# Patient Record
Sex: Male | Born: 1975
Health system: Southern US, Community
[De-identification: ages and names within clinical notes are randomized; demographics above are authoritative.]

## PROBLEM LIST (undated history)

## (undated) ENCOUNTER — Ambulatory Visit: Discharge status: Absent without leave | Payer: 59

## (undated) DIAGNOSIS — E785 Hyperlipidemia, unspecified: Secondary | ICD-10-CM

## (undated) DIAGNOSIS — K219 Gastro-esophageal reflux disease without esophagitis: Secondary | ICD-10-CM

## (undated) DIAGNOSIS — M549 Dorsalgia, unspecified: Secondary | ICD-10-CM

## (undated) DIAGNOSIS — I10 Essential (primary) hypertension: Secondary | ICD-10-CM

## (undated) DIAGNOSIS — K5900 Constipation, unspecified: Secondary | ICD-10-CM

## (undated) HISTORY — DX: Gastro-esophageal reflux disease without esophagitis: K21.9

## (undated) HISTORY — DX: Constipation, unspecified: K59.00

## (undated) HISTORY — DX: Hyperlipidemia, unspecified: E78.5

## (undated) HISTORY — DX: Dorsalgia, unspecified: M54.9

## (undated) HISTORY — DX: Essential (primary) hypertension: I10

## (undated) HISTORY — PX: VASECTOMY: SHX75

---

## 2013-10-11 ENCOUNTER — Emergency Department (HOSPITAL_COMMUNITY)
Admission: EM | Admit: 2013-10-11 | Discharge: 2013-10-11 | Disposition: A | Discharge status: Home / Self Care | Payer: 59 | Attending: Emergency Medicine | Admitting: Emergency Medicine

## 2013-10-11 ENCOUNTER — Emergency Department (HOSPITAL_COMMUNITY): Payer: 59

## 2013-10-11 ENCOUNTER — Encounter (HOSPITAL_COMMUNITY): Payer: Self-pay | Admitting: Emergency Medicine

## 2013-10-11 DIAGNOSIS — R51 Headache: Secondary | ICD-10-CM | POA: Insufficient documentation

## 2013-10-11 DIAGNOSIS — Z79899 Other long term (current) drug therapy: Secondary | ICD-10-CM | POA: Insufficient documentation

## 2013-10-11 DIAGNOSIS — I10 Essential (primary) hypertension: Secondary | ICD-10-CM | POA: Insufficient documentation

## 2013-10-11 DIAGNOSIS — M5020 Other cervical disc displacement, unspecified cervical region: Secondary | ICD-10-CM

## 2013-10-11 DIAGNOSIS — R519 Headache, unspecified: Secondary | ICD-10-CM

## 2013-10-11 LAB — CBC WITH DIFFERENTIAL/PLATELET
Eosinophils Absolute: 0.1 10*3/uL (ref 0.0–0.7)
Eosinophils Relative: 2 % (ref 0–5)
Hemoglobin: 15.7 g/dL (ref 13.0–17.0)
Lymphocytes Relative: 40 % (ref 12–46)
Lymphs Abs: 3 10*3/uL (ref 0.7–4.0)
Monocytes Absolute: 0.4 10*3/uL (ref 0.1–1.0)
Monocytes Relative: 5 % (ref 3–12)
Neutro Abs: 3.9 10*3/uL (ref 1.7–7.7)
Neutrophils Relative %: 53 % (ref 43–77)
Platelets: 284 10*3/uL (ref 150–400)
RBC: 5.4 MIL/uL (ref 4.22–5.81)
WBC: 7.4 10*3/uL (ref 4.0–10.5)

## 2013-10-11 LAB — POCT I-STAT, CHEM 8
BUN: 12 mg/dL (ref 6–23)
Chloride: 100 mEq/L (ref 96–112)
Creatinine, Ser: 1.3 mg/dL (ref 0.50–1.35)
Glucose, Bld: 86 mg/dL (ref 70–99)
Hemoglobin: 15.3 g/dL (ref 13.0–17.0)
Potassium: 3.5 mEq/L (ref 3.5–5.1)
Sodium: 140 mEq/L (ref 135–145)
TCO2: 26 mmol/L (ref 0–100)

## 2013-10-11 LAB — GLUCOSE, CAPILLARY

## 2013-10-11 MED ORDER — METOCLOPRAMIDE HCL 5 MG/ML IJ SOLN
10.0000 mg | Freq: Once | INTRAMUSCULAR | Status: AC
  Administered 2013-10-11: 10 mg via INTRAVENOUS
  Filled 2013-10-11: qty 2

## 2013-10-11 MED ORDER — METHYLPREDNISOLONE 4 MG PO KIT: PACK | ORAL | Status: DC

## 2013-10-11 MED ORDER — DEXAMETHASONE SODIUM PHOSPHATE 10 MG/ML IJ SOLN
10.0000 mg | Freq: Once | INTRAMUSCULAR | Status: AC
  Administered 2013-10-11: 10 mg via INTRAVENOUS
  Filled 2013-10-11: qty 1

## 2013-10-11 MED ORDER — DIPHENHYDRAMINE HCL 50 MG/ML IJ SOLN
25.0000 mg | Freq: Once | INTRAMUSCULAR | Status: AC
  Administered 2013-10-11: 25 mg via INTRAVENOUS
  Filled 2013-10-11: qty 1

## 2013-10-11 MED ORDER — SODIUM CHLORIDE 0.9 % IV SOLN
Freq: Once | INTRAVENOUS | Status: AC
  Administered 2013-10-11: 20:00:00 via INTRAVENOUS

## 2013-10-11 NOTE — ED Notes (Signed)
Patient transported to CT 

## 2013-10-11 NOTE — ED Notes (Signed)
Reports onset LUE numbness since yesterday at 1300 which has continued till today. Upon awakening today noticed left side face & neck numb in addition to LUE. Denies weakness. Went to MD's office today, newly diagnosed HTN, took first dose BP med this afternoon. Was instructed to go to ED for h/a or worsening symptoms. States h/a started at 1530.

## 2013-10-11 NOTE — ED Notes (Signed)
CBG taken = 85 

## 2013-10-11 NOTE — ED Notes (Signed)
MD at Bedside.

## 2013-10-11 NOTE — ED Provider Notes (Addendum)
CSN: [id number]     Arrival date & time 10/11/13  1558 History   First MD Initiated Contact with Patient 10/11/13 1832     Chief Complaint  Patient presents with  . Hypertension   (Consider location/radiation/quality/duration/timing/severity/associated sxs/prior Treatment) HPI Comments: Pt states yesterday he noticed left arm numbness and tingling worse in the C6 distribution but affecting the whole arm.  However when he woke up today the arm was still affected but now the left cheek and side of neck.  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  He saw PCP today which they noticed he was HTN at 189/somthing and gave him lisinopril and d/ced home.  Pt took lisinopirl at noon and developed a occipital headache around 3 and rechecked pressure and it was 167/90.  He came here for further eval.  No prior hx of HTN.  Pt last checked BP this summer and 130's.    The history is provided by the patient.    History reviewed. No pertinent past medical history. History reviewed. No pertinent past surgical history. History reviewed. No pertinent family history. History  Substance Use Topics  . Smoking status: Never Smoker   . Smokeless tobacco: Not on file  . Alcohol Use: No    Review of Systems  Constitutional: Negative for fever.  Respiratory: Negative for cough, chest tightness and shortness of breath.   Cardiovascular: Negative for chest pain, palpitations and leg swelling.  Gastrointestinal: Negative for nausea, vomiting and abdominal pain.  Neurological: Positive for numbness and headaches. Negative for facial asymmetry and weakness.  All other systems reviewed and are negative.    Allergies  Review of patient's allergies indicates no known allergies.  Home Medications   Current Outpatient Rx  Name  Route  Sig  Dispense  Refill  . aspirin-acetaminophen-caffeine (EXCEDRIN MIGRAINE)  250-250-65 MG per tablet   Oral   Take 2 tablets by mouth daily as needed for headache.         . lisinopril (PRINIVIL,ZESTRIL) 20 MG tablet   Oral   Take 20 mg by mouth daily.          BP 131/82  Pulse 60  Temp(Src) 98.5 F (36.9 C) (Oral)  Resp 16  SpO2 97% Physical Exam  Nursing note and vitals reviewed. Constitutional: He is oriented to person, place, and time. He appears well-developed and well-nourished. No distress.  HENT:  Head: Normocephalic and atraumatic.  Mouth/Throat: Oropharynx is clear and moist.  Eyes: Conjunctivae and EOM are normal. Pupils are equal, round, and reactive to light.  Neck: Normal range of motion. Neck supple. Normal carotid pulses present. No spinous process tenderness and no muscular tenderness present. Carotid bruit is not present. Normal range of motion present.  Cardiovascular: Normal rate, regular rhythm and intact distal pulses.   No murmur heard. Pulmonary/Chest: Effort normal and breath sounds normal. No respiratory distress. He has no wheezes. He has no rales.  Abdominal: Soft. He exhibits no distension. There is no tenderness. There is no rebound and no guarding.  Musculoskeletal: Normal range of motion. He exhibits no edema and no tenderness.  Neurological: He is alert and oriented to person, place, and time. He has normal strength. Coordination and gait normal.  Decreased sensation in the left face in V3/V2 dermatome and also in C2.  Arm globally decreased sensation but most pronounced in the C6  distribution  Skin: Skin is warm and dry. No rash noted. No erythema.  Psychiatric: He has a normal mood and affect. His behavior is normal.    ED Course  Procedures (including critical care time) Labs Review Labs Reviewed  GLUCOSE, CAPILLARY  GLUCOSE, CAPILLARY  CBC WITH DIFFERENTIAL  POCT I-STAT, CHEM 8   Imaging Review Ct Head (brain) Wo Contrast  10/11/2013   CLINICAL DATA:  Headache, hypertension, numbness left side of face and  hand  EXAM: CT HEAD WITHOUT CONTRAST  TECHNIQUE: Contiguous axial images were obtained from the base of the skull through the vertex without intravenous contrast.  COMPARISON:  None  FINDINGS: Normal ventricular morphology.  No midline shift or mass effect.  Normal appearance of brain parenchyma.  No definite intracranial hemorrhage, mass lesion or evidence acute infarction.  Single tiny focus of hyperdensity identified at the high left parietal region image 26, nonspecific.  Bones and sinuses unremarkable.  IMPRESSION: No definite acute intracranial abnormalities.   Electronically Signed   By: Ulyses Southward M.D.   On: 10/11/2013 18:56   Mr Brain Wo Contrast  10/11/2013   CLINICAL DATA:  Hypertension, numbness and tingling in extremities.  EXAM: MRI HEAD WITHOUT CONTRAST  MRI CERVICAL SPINE WITHOUT CONTRAST  TECHNIQUE: Multiplanar, multiecho pulse sequences of the brain and surrounding structures, and cervical spine, to include the craniocervical junction and cervicothoracic junction, were obtained without intravenous contrast.  COMPARISON:  Prior head CT performed earlier on the same day  FINDINGS: MRI HEAD FINDINGS  A few scattered subcentimeter foci of hyperintense T2/FLAIR signal are seen within the deep white matter of both frontal lobes, most evident within the deep white matter of the left frontal lobe (series 7, image 17). These measure up to 5 mm in diameter. These are nonspecific, and may be related to chronic small vessel disk ischemic disease or possibly migrainous disorder. No signal foci to suggest underlying demyelinating disease identified. Corpus callosum is normal. No infratentorial lesions identified.  No mass or midline shift. CSF containing spaces are within normal limits without evidence of significant atrophy. Ventricles are normal in size without evidence of hydrocephalus. There is no extra-axial fluid collection. The craniocervical junction demonstrates a normal appearance. Pituitary gland  is normal. Orbits, optic nerves, and optic chiasm are within normal limits.  No diffusion-weighted signal abnormality to suggest acute intracranial infarct identified. Normal flow voids are seen within the intracranial circulation. There is no intracranial hemorrhage on gradient echo sequence.  The calvarium demonstrates a normal signal intensity with normal appearance. Scalp soft tissues are within normal limits.  Paranasal sinuses and mastoid air cells are well pneumatized and free of fluid.  MRI CERVICAL SPINE FINDINGS  The craniocervical junction a is widely patent and normal in appearance.  The vertebral bodies are normally aligned with preservation of the normal cervical lordosis. Vertebral body heights are well maintained. Intervertebral disc heights are well preserved.  Signal intensity within the vertebral body bone marrow and spinal cord is normal.  At C2-3, no canal or neural foraminal stenosis identified. No focal disc protrusion or significant degenerative disc disease.  At C3-4, there is mild degenerative disk osteophyte and bilateral facet arthrosis. No significant canal or neural foraminal stenosis.  At C4-5, there is no significant canal or neural foraminal stenosis. Mild left-sided facet arthrosis is noted at this level.  At C5-6, there is a right paracentral disc protrusion which flattens the ventral thecal sac and partially flattens the right lateral aspect of the spinal cord (  series 17, image 11). The thecal sac remains widely patent. Mild bilateral facet arthrosis with uncovertebral osteophytosis is present at this level resulting in moderate right with mild left foraminal stenosis.  At C6-7, there is moderate bilateral facet arthrosis with mild diffuse degenerative disk osteophyte. There is resultant mild bilateral foraminal stenosis, right greater than left. No significant central canal stenosis.  At C7-T1, there is mild diffuse degenerative disk osteophyte without significant canal or neural  foraminal stenosis.  Visualized soft tissues of the neck are within normal limits.  IMPRESSION: MRI HEAD:  1. No acute intracranial infarct or other process identified. 2. Few scattered subcentimeter foci of T2/FLAIR hyperintensity within the deep white matter of both frontal lobes. These findings are nonspecific, and may be related to chronic microvascular ischemic changes. Foci such is these have also been described with migrainous disorders. These would not be typical of underlying demyelinating process.  MRI CERVICAL SPINE:  1. Right paracentral disc protrusion at C5-6 with resultant moderate right and mild left foraminal stenosis. There is flattening of the right ventral thecal sac and right aspect of the spinal cord at this level. 2. Additional mild multilevel degenerative disc disease and facet arthrosis as detailed above. No significant neural impingement or canal stenosis identified within the cervical spine to explain numbness/ tingling in extremities.   Electronically Signed   By: Rise Mu M.D.   On: 10/11/2013 22:41   Mr Cervical Spine Wo Contrast  10/11/2013   CLINICAL DATA:  Hypertension, numbness and tingling in extremities.  EXAM: MRI HEAD WITHOUT CONTRAST  MRI CERVICAL SPINE WITHOUT CONTRAST  TECHNIQUE: Multiplanar, multiecho pulse sequences of the brain and surrounding structures, and cervical spine, to include the craniocervical junction and cervicothoracic junction, were obtained without intravenous contrast.  COMPARISON:  Prior head CT performed earlier on the same day  FINDINGS: MRI HEAD FINDINGS  A few scattered subcentimeter foci of hyperintense T2/FLAIR signal are seen within the deep white matter of both frontal lobes, most evident within the deep white matter of the left frontal lobe (series 7, image 17). These measure up to 5 mm in diameter. These are nonspecific, and may be related to chronic small vessel disk ischemic disease or possibly migrainous disorder. No signal  foci to suggest underlying demyelinating disease identified. Corpus callosum is normal. No infratentorial lesions identified.  No mass or midline shift. CSF containing spaces are within normal limits without evidence of significant atrophy. Ventricles are normal in size without evidence of hydrocephalus. There is no extra-axial fluid collection. The craniocervical junction demonstrates a normal appearance. Pituitary gland is normal. Orbits, optic nerves, and optic chiasm are within normal limits.  No diffusion-weighted signal abnormality to suggest acute intracranial infarct identified. Normal flow voids are seen within the intracranial circulation. There is no intracranial hemorrhage on gradient echo sequence.  The calvarium demonstrates a normal signal intensity with normal appearance. Scalp soft tissues are within normal limits.  Paranasal sinuses and mastoid air cells are well pneumatized and free of fluid.  MRI CERVICAL SPINE FINDINGS  The craniocervical junction a is widely patent and normal in appearance.  The vertebral bodies are normally aligned with preservation of the normal cervical lordosis. Vertebral body heights are well maintained. Intervertebral disc heights are well preserved.  Signal intensity within the vertebral body bone marrow and spinal cord is normal.  At C2-3, no canal or neural foraminal stenosis identified. No focal disc protrusion or significant degenerative disc disease.  At C3-4, there is mild degenerative disk osteophyte  and bilateral facet arthrosis. No significant canal or neural foraminal stenosis.  At C4-5, there is no significant canal or neural foraminal stenosis. Mild left-sided facet arthrosis is noted at this level.  At C5-6, there is a right paracentral disc protrusion which flattens the ventral thecal sac and partially flattens the right lateral aspect of the spinal cord (series 17, image 11). The thecal sac remains widely patent. Mild bilateral facet arthrosis with  uncovertebral osteophytosis is present at this level resulting in moderate right with mild left foraminal stenosis.  At C6-7, there is moderate bilateral facet arthrosis with mild diffuse degenerative disk osteophyte. There is resultant mild bilateral foraminal stenosis, right greater than left. No significant central canal stenosis.  At C7-T1, there is mild diffuse degenerative disk osteophyte without significant canal or neural foraminal stenosis.  Visualized soft tissues of the neck are within normal limits.  IMPRESSION: MRI HEAD:  1. No acute intracranial infarct or other process identified. 2. Few scattered subcentimeter foci of T2/FLAIR hyperintensity within the deep white matter of both frontal lobes. These findings are nonspecific, and may be related to chronic microvascular ischemic changes. Foci such is these have also been described with migrainous disorders. These would not be typical of underlying demyelinating process.  MRI CERVICAL SPINE:  1. Right paracentral disc protrusion at C5-6 with resultant moderate right and mild left foraminal stenosis. There is flattening of the right ventral thecal sac and right aspect of the spinal cord at this level. 2. Additional mild multilevel degenerative disc disease and facet arthrosis as detailed above. No significant neural impingement or canal stenosis identified within the cervical spine to explain numbness/ tingling in extremities.   Electronically Signed   By: Rise Mu M.D.   On: 10/11/2013 22:41    EKG Interpretation   None       MDM   1. Cervical disc herniation   2. Headache     Patient with unusual symptoms today of hypertension, headache that were noted today but left-sided arm and facial numbness starting yesterday. Patient has subjective decreased sensation in the left distribution as described above. He does work at a computer all day long so concern for cervical pathology versus complicated migraine versus other  intracranial lesion. A head CT initially noted no acute pathology however a more detailed report remarks upon a nonspecific hypodensity in the high left parietal region which could be a cause of his symptoms. Patient had never had hypertension before today he states he checked in approximately 5 months ago that was normal and since that time he has lost 30 pounds. Over the last month he states he had the more salty foods and not watched his diet as closely but today when he was at the doctor's office his pressure was in the 180s systolic and he was given lisinopril which is improved his blood pressure.  He is still complaining of headaches will give him a headache cocktail but he did not get headaches routinely. The headache is not characteristic of a subarachnoid hemorrhage and he denies any infectious symptoms.  We'll get an MR of the head and neck for further evaluation.  11:05 PM MRI without brain abnormality.  MR c-spine with C6 findings.  This would explain pt's pain.  Will treat with steroids.  HA resolved.  Discussed watching blood pressure but may not need lisinopril.  Gwyneth Sprout, MD 10/11/13 1610  Gwyneth Sprout, MD 10/11/13 [phone]

## 2013-10-11 NOTE — ED Notes (Signed)
Patient transported to MR. 

## 2013-10-11 NOTE — ED Notes (Signed)
Reconnected all the diagnostic equipment.

## 2013-10-11 NOTE — ED Notes (Addendum)
Patient returned from MR. 

## 2013-10-11 NOTE — ED Notes (Signed)
Pt reports 2 weeks ago he noticed his vision was blurry, yesterday he had L arm numbness and then woke today to shave and the l side of his face felt numb, went to doctor and his BP was elevated. States they gave him lisinopril in the office but when he got home checked and it was still 160s systolic and his head has started to hurt. A&ox4, no past history of HTN

## 2013-10-12 ENCOUNTER — Other Ambulatory Visit (HOSPITAL_BASED_OUTPATIENT_CLINIC_OR_DEPARTMENT_OTHER): Payer: Self-pay | Admitting: Family Medicine

## 2013-10-12 DIAGNOSIS — I10 Essential (primary) hypertension: Secondary | ICD-10-CM

## 2013-10-16 ENCOUNTER — Ambulatory Visit (HOSPITAL_BASED_OUTPATIENT_CLINIC_OR_DEPARTMENT_OTHER)
Admission: RE | Admit: 2013-10-16 | Discharge: 2013-10-16 | Disposition: A | Discharge status: Home / Self Care | Payer: 59 | Source: Ambulatory Visit | Attending: Family Medicine | Admitting: Family Medicine

## 2013-10-16 DIAGNOSIS — R209 Unspecified disturbances of skin sensation: Secondary | ICD-10-CM | POA: Insufficient documentation

## 2013-10-16 DIAGNOSIS — I10 Essential (primary) hypertension: Secondary | ICD-10-CM | POA: Insufficient documentation

## 2013-10-16 DIAGNOSIS — I6529 Occlusion and stenosis of unspecified carotid artery: Secondary | ICD-10-CM | POA: Insufficient documentation

## 2014-09-03 ENCOUNTER — Other Ambulatory Visit: Payer: Self-pay | Admitting: Family Medicine

## 2014-09-03 ENCOUNTER — Ambulatory Visit (HOSPITAL_BASED_OUTPATIENT_CLINIC_OR_DEPARTMENT_OTHER)
Admission: RE | Admit: 2014-09-03 | Discharge: 2014-09-03 | Disposition: A | Discharge status: Home / Self Care | Payer: 59 | Source: Ambulatory Visit | Attending: Family Medicine | Admitting: Family Medicine

## 2014-09-03 DIAGNOSIS — R1010 Upper abdominal pain, unspecified: Secondary | ICD-10-CM | POA: Insufficient documentation

## 2014-11-26 IMAGING — US US ABDOMEN COMPLETE
1 series · 14 of 25 positions shown · non-contrast
Comparison: None.

CLINICAL DATA: Right upper quadrant pain epigastric pain for 2 days

EXAM:
ULTRASOUND ABDOMEN COMPLETE

[Series 1: us abdomen complete · 0.27mm/px · 14 of 68 slices shown]
[im 1/68]
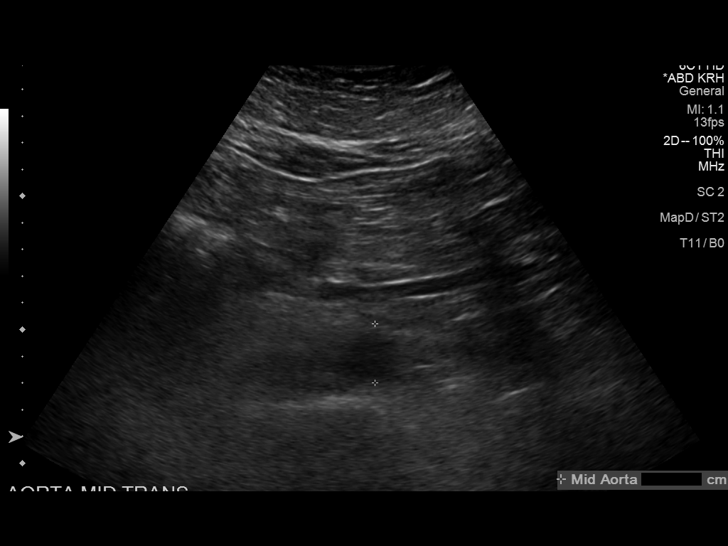
[im 6/68]
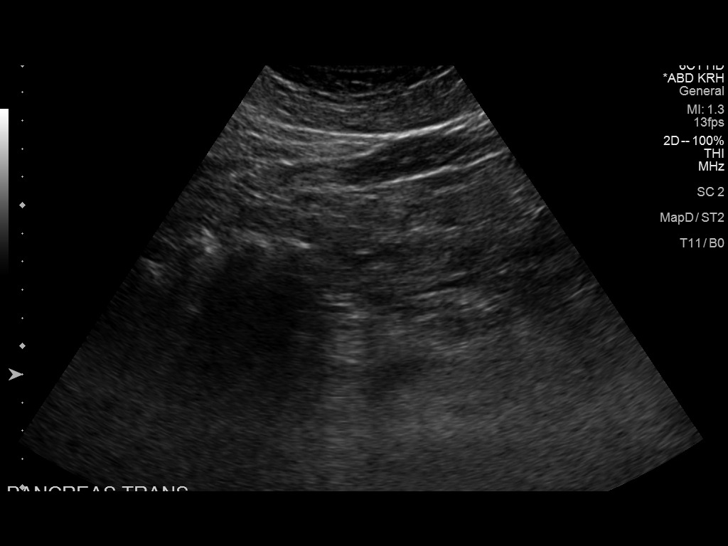
[im 12/68]
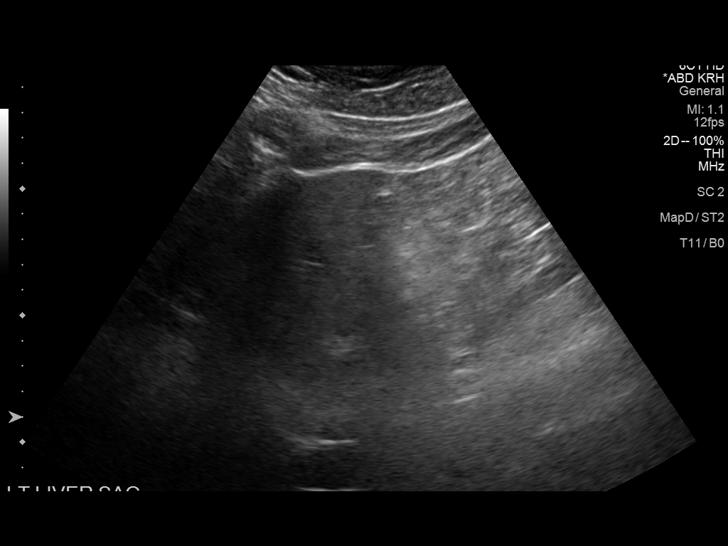
[im 17/68]
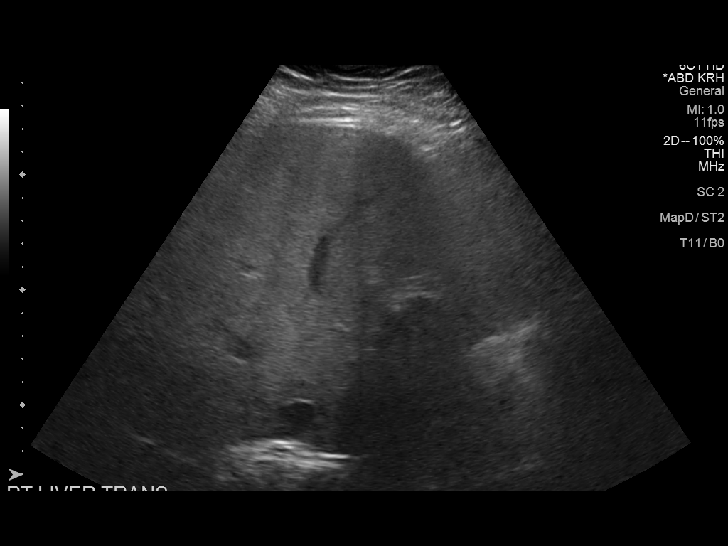
[im 23/68]
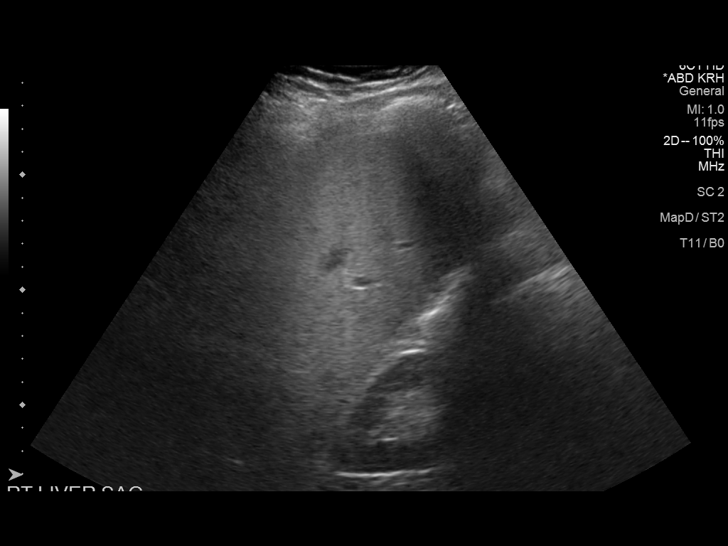
[im 26/68]
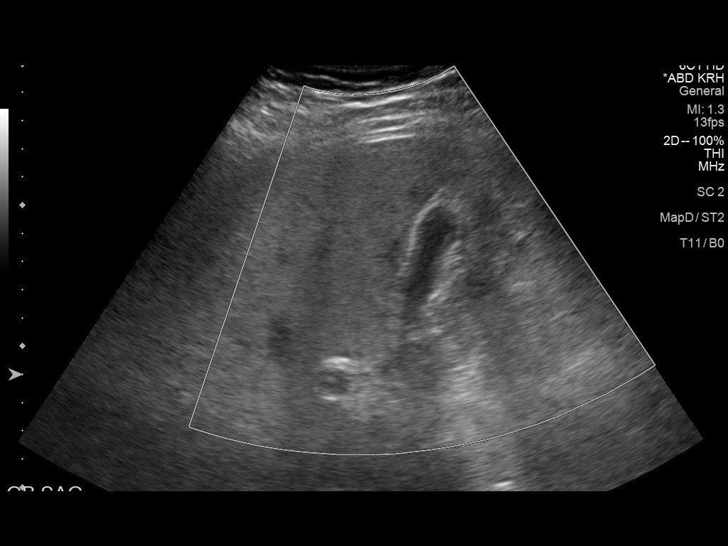
[im 31/68]
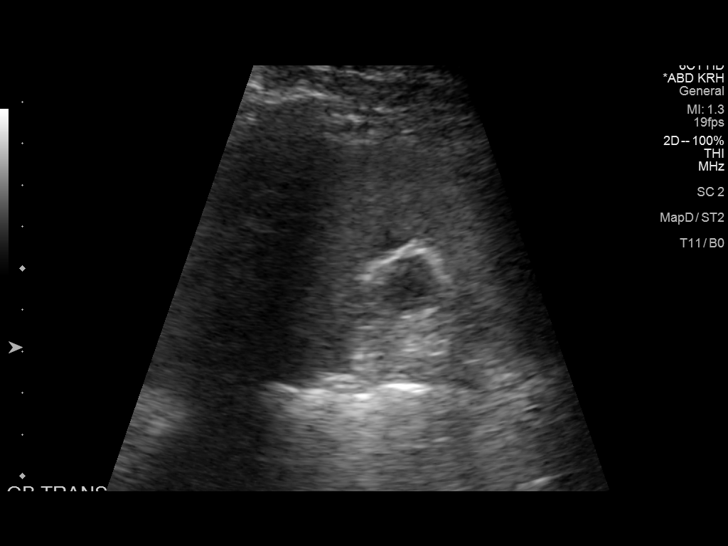
[im 37/68]
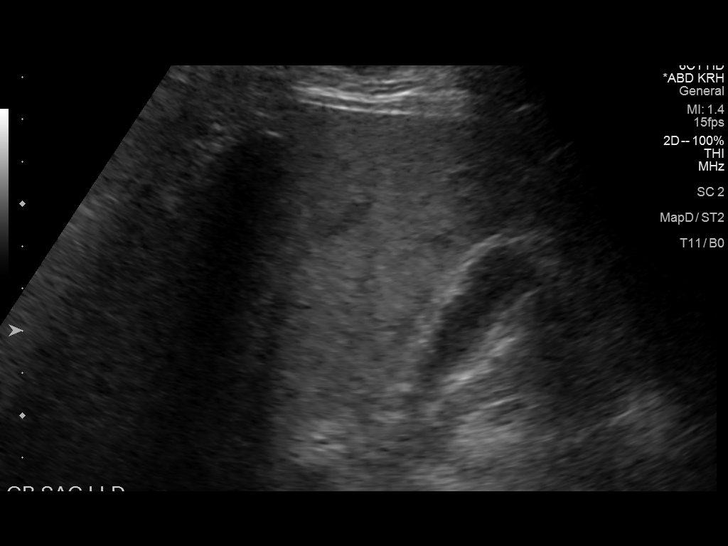
[im 42/68]
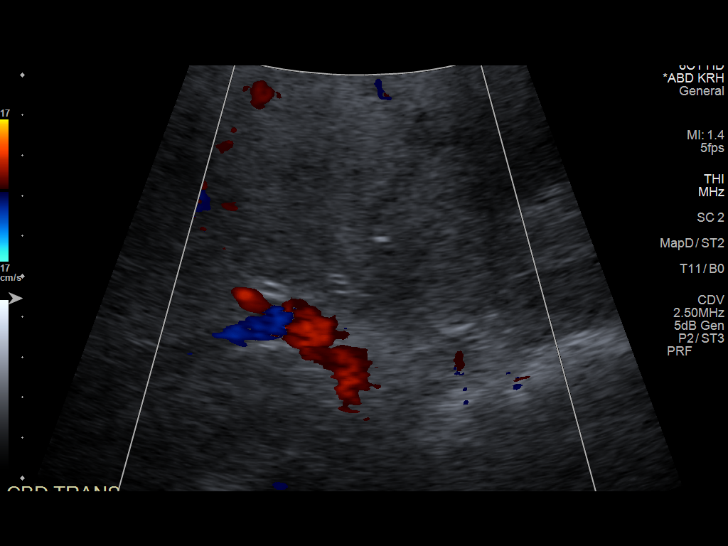
[im 45/68]
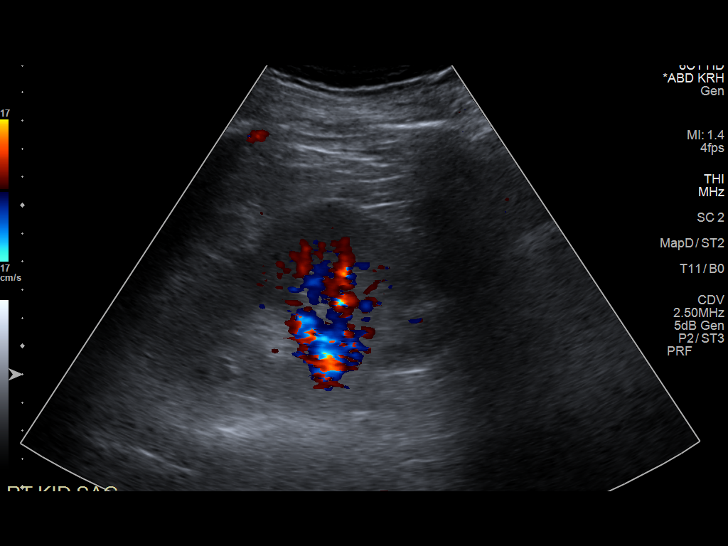
[im 51/68]
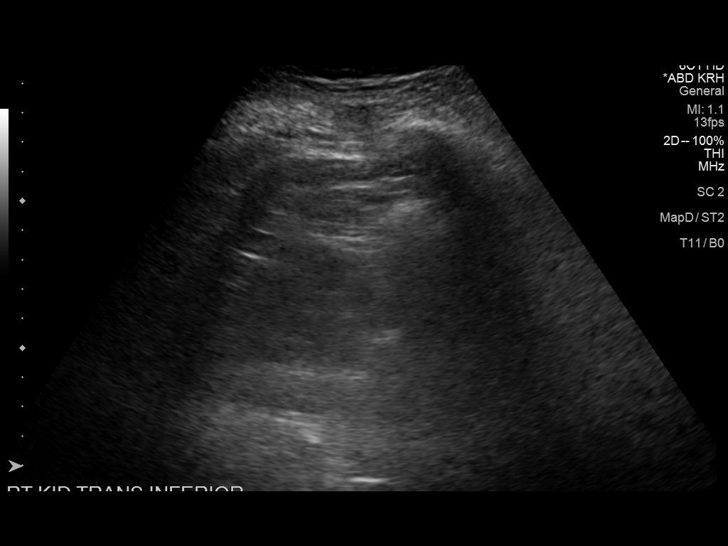
[im 56/68]
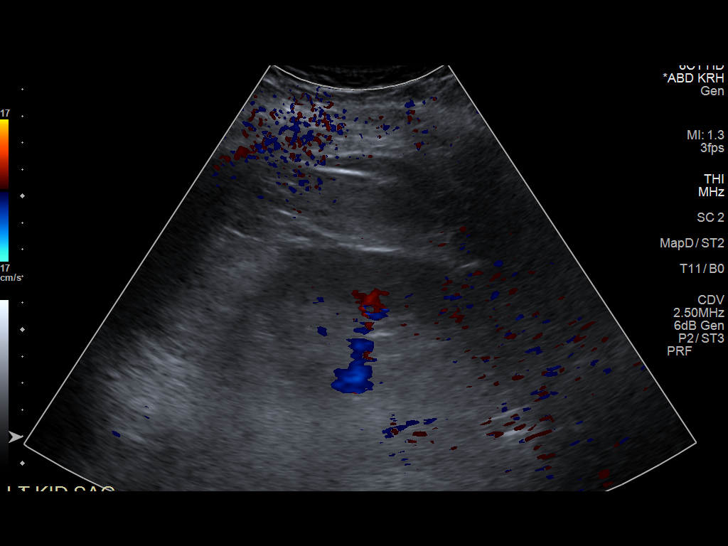
[im 62/68]
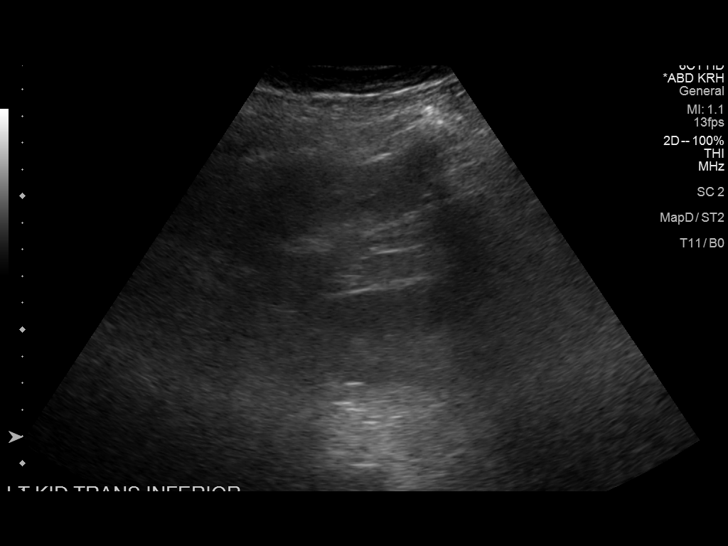
[im 68/68]
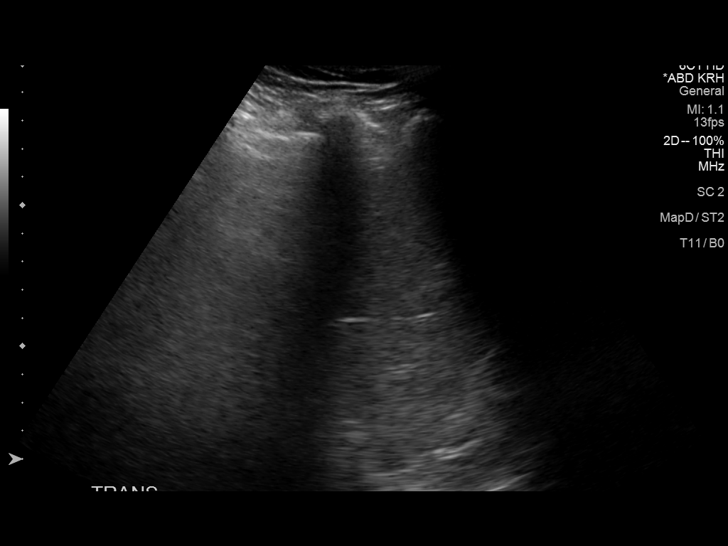

[14 of 25 positions shown; findings below may reference images not displayed]

FINDINGS: Gallbladder: Gallbladder is contracted with thickened wall measures
11 mm thickness. No pericholecystic fluid. No shadowing gallstones.
No sonographic Murphy's sign.

Common bile duct: Diameter: 2 mm in diameter within normal limits.

Liver: No focal hepatic mass. Liver shows diffuse increased
echogenicity suspicious for fatty infiltration.

IVC: Limited assessment due to bowel gas.

Pancreas: Limited assessment due to abundant bowel gas

Spleen: Measures 4.6 cm in length.  No focal splenic lesion.

Right Kidney: Length: 11.7 cm. Echogenicity within normal limits. No
mass or hydronephrosis visualized.

Left Kidney: Length: 13 cm. Echogenicity within normal limits. No
mass or hydronephrosis visualized.

Abdominal aorta: No aneurysm visualized. Measures up to 2.1 cm in
diameter.

Other findings: None.
IMPRESSION: 1. Contracted gallbladder with thickened wall without sonographic
Murphy sign or pericholecystic fluid. No gallstones. Normal CBD.
2. No hydronephrosis.
3. No aortic aneurysm.

## 2016-03-15 ENCOUNTER — Encounter: Payer: Self-pay | Admitting: Family Medicine

## 2016-03-15 DIAGNOSIS — I10 Essential (primary) hypertension: Secondary | ICD-10-CM | POA: Insufficient documentation

## 2016-03-15 DIAGNOSIS — E785 Hyperlipidemia, unspecified: Secondary | ICD-10-CM | POA: Insufficient documentation

## 2016-03-16 ENCOUNTER — Encounter: Payer: Self-pay | Admitting: Family Medicine

## 2016-03-16 ENCOUNTER — Ambulatory Visit (INDEPENDENT_AMBULATORY_CARE_PROVIDER_SITE_OTHER): Payer: Managed Care, Other (non HMO) | Admitting: Family Medicine

## 2016-03-16 VITALS — BP 130/90 | HR 60 | Temp 98.5°F | Resp 12 | Ht 66.0 in | Wt 233.0 lb

## 2016-03-16 DIAGNOSIS — Z6837 Body mass index (BMI) 37.0-37.9, adult: Secondary | ICD-10-CM | POA: Diagnosis not present

## 2016-03-16 DIAGNOSIS — E785 Hyperlipidemia, unspecified: Secondary | ICD-10-CM

## 2016-03-16 DIAGNOSIS — Z131 Encounter for screening for diabetes mellitus: Secondary | ICD-10-CM | POA: Diagnosis not present

## 2016-03-16 DIAGNOSIS — Z0000 Encounter for general adult medical examination without abnormal findings: Secondary | ICD-10-CM

## 2016-03-16 DIAGNOSIS — I10 Essential (primary) hypertension: Secondary | ICD-10-CM | POA: Diagnosis not present

## 2016-03-16 LAB — LIPID PANEL
CHOL/HDL RATIO: 6
Cholesterol: 239 mg/dL — ABNORMAL HIGH (ref 0–200)
HDL: 42.9 mg/dL (ref >39.00)
LDL CALC: 167 mg/dL — AB (ref 0–99)
NonHDL: 196.01
Triglycerides: 146 mg/dL (ref 0.0–149.0)
VLDL: 29.2 mg/dL (ref 0.0–40.0)

## 2016-03-16 LAB — COMPREHENSIVE METABOLIC PANEL
ALBUMIN: 4.8 g/dL (ref 3.5–5.2)
ALT: 22 U/L (ref 0–53)
AST: 20 U/L (ref 0–37)
Alkaline Phosphatase: 64 U/L (ref 39–117)
BUN: 18 mg/dL (ref 6–23)
CHLORIDE: 98 meq/L (ref 96–112)
CO2: 31 meq/L (ref 19–32)
CREATININE: 1.02 mg/dL (ref 0.40–1.50)
Calcium: 9.9 mg/dL (ref 8.4–10.5)
GFR: 86.02 mL/min (ref >60.00)
Glucose, Bld: 83 mg/dL (ref 70–99)
Potassium: 3.8 mEq/L (ref 3.5–5.1)
SODIUM: 138 meq/L (ref 135–145)
Total Bilirubin: 0.5 mg/dL (ref 0.2–1.2)
Total Protein: 7.5 g/dL (ref 6.0–8.3)

## 2016-03-16 LAB — HEMOGLOBIN A1C: Hgb A1c MFr Bld: 5.9 % (ref 4.6–6.5)

## 2016-03-16 MED ORDER — LISINOPRIL-HYDROCHLOROTHIAZIDE 20-12.5 MG PO TABS: 1.0000 | ORAL_TABLET | Freq: Every day | ORAL | Status: DC

## 2016-03-16 NOTE — Progress Notes (Signed)
Pre visit review using our clinic review tool, if applicable. No additional management support is needed unless otherwise documented below in the visit note. 

## 2016-03-16 NOTE — Patient Instructions (Addendum)
   A few things to remember from today's visit:   1. Routine physical examination  - Hemoglobin A1c  2. Essential hypertension, benign  - lisinopril-hydrochlorothiazide (PRINZIDE,ZESTORETIC) 20-12.5 MG tablet; Take 1 tablet by mouth daily.  Dispense: 90 tablet; Refill: 1 - Comprehensive metabolic panel  3. Hyperlipidemia  - Lipid Panel - Comprehensive metabolic panel  4. BMI 37.0-37.9, adult  - Comprehensive metabolic panel  5. Diabetes mellitus screening  - Hemoglobin A1c       What are some tips for weight loss? People become overweight for many reasons. Weight issues can run in families. They can be caused by unhealthy behaviors and a person's environment. Certain health problems and medicines can also lead to weight gain. There are some simple things you can do to reach and maintain a healthy weight:  Eat 500 fewer calories per day than your body needs to maintain your weight. Women should aim for no more than 1,200 to 1,500 calories per day. Men should aim for 1,500 to 1,800 calories per day. Avoid sweet drinks. These include regular soft drinks, fruit juices, fruit drinks, energy drinks, sweetened iced tea, and flavored milk. Avoid fast foods. Fast foods such as french fries, hamburgers, chicken nuggets, and pizza are high in calories and can cause weight gain. Eat a healthy breakfast. People who skip breakfast tend to weigh more. Don't watch more than two hours of television per day. Chew sugar-free gum between meals to cut down on snacking. Avoid grocery shopping when you're hungry. Pack a healthy lunch instead of eating out to control what and how much you eat. Eat a lot of fruits and vegetables. Aim for about 2 cups of fruit and 2 to 3 cups of vegetables per day. Aim for 150 minutes per week of moderate-intensity exercise (such as brisk walking), or 75 minutes per week of vigorous exercise (such as jogging or running). Be more active. Small changes in physical  activity can easily be added to your daily routine. For example, take the stairs instead of the elevator. Take a walk with your family. A daily walk is a great way to get exercise and to catch up on the day's events.

## 2016-03-16 NOTE — Progress Notes (Signed)
Subjective:    Patient ID: Rodney Beltran, male    DOB: 1976-06-29, 40 y.o.   MRN: [id number]  HPI   [name] is a 40 y.o.male here today to establish care with [facility], I had seen him before in [facility], 2015. Today he is here for his routine physical and needs refills on his antihypertensive medication.  He lives with wife and children.  He has Hx of HTN,HLD, and obesity.  Last routine physical was 08/2014.  He tries to follow a healthy diet but no consistently and currently he is not exercises regularly. His job involves frequent travel, so it is difficult to follow a healthy lifestyle consistently.  Concerns today: None.   Hypertension: Dx a few years ago. Currently h is on Lisinopril-HCTZ 20-12.5 mg daily. He did not take medication this past weekend, resumed it yesterday. No side effects reported. It is otherwise stable, better when he is complaint with diet and medications.  He has not noted unusual headache, visual changes, exertional chest pain, dyspnea,  focal weakness, or edema.  Cr 0.9 in 08/2014.  Hyperlipidemia:  Following a low fat diet. 08/2014 FLP: TC 228, LDL 160,HDL 47, TG 107.   Review of Systems  Constitutional: Negative for fever, activity change, appetite change, fatigue and unexpected weight change.  HENT: Negative for dental problem, hearing loss, mouth sores, nosebleeds, sore throat, trouble swallowing and voice change.   Eyes: Negative for redness and visual disturbance.  Respiratory: Negative for apnea, cough, shortness of breath and wheezing.   Cardiovascular: Negative for chest pain, palpitations and leg swelling.  Gastrointestinal: Negative for nausea, vomiting, abdominal pain and blood in stool.       No changes in bowel habits.  Endocrine: Negative for polydipsia and polyuria.  Genitourinary: Negative for dysuria, hematuria, decreased urine volume, genital sores and testicular pain.  Musculoskeletal:  Positive for arthralgias (occasional after certain activities, mainly knees). Negative for myalgias, back pain, joint swelling and neck pain.  Skin: Negative for color change and rash.  Neurological: Negative for dizziness, seizures, syncope, weakness, numbness and headaches.  Hematological: Negative for adenopathy. Does not bruise/bleed easily.  Psychiatric/Behavioral: Negative for behavioral problems, confusion and sleep disturbance. The patient is not nervous/anxious.      No current outpatient prescriptions on file prior to visit.   No current facility-administered medications on file prior to visit.     Past Medical History  Diagnosis Date  . Hyperlipidemia   . Hypertension     Social History   Social History  . Marital Status: Married    Spouse Name: N/A  . Number of Children: N/A  . Years of Education: N/A   Social History Main Topics  . Smoking status: Never Smoker   . Smokeless tobacco: None  . Alcohol Use: No  . Drug Use: No  . Sexual Activity: Yes   Other Topics Concern  . None   Social History Narrative   Family History  Problem Relation Age of Onset  . Lupus Mother   . Mental illness Maternal Aunt   . Diabetes Maternal Aunt   . Diabetes Maternal Uncle   . Diabetes Maternal Grandmother   . Hypertension Maternal Grandmother   . Hyperlipidemia Maternal Grandmother   . Mental illness Maternal Grandfather      Filed Vitals:   03/16/16 1352  BP: 130/90  Pulse: 60  Temp: 98.5 F (36.9 C)  Resp: 12   Body mass index is 37.63 kg/(m^2).  SpO2 Readings from Last  3 Encounters:  03/16/16 98%  10/11/13 95%       Objective:   Physical Exam  Constitutional: He is oriented to person, place, and time. He appears well-developed. He does not appear ill. No distress.  HENT:  Head: Atraumatic.  Right Ear: Hearing, tympanic membrane, external ear and ear canal normal.  Left Ear: Hearing, tympanic membrane, external ear and ear canal normal.    Mouth/Throat: Uvula is midline, oropharynx is clear and moist and mucous membranes are normal.  Eyes: Conjunctivae, EOM and lids are normal. Pupils are equal, round, and reactive to light.  Neck: Normal range of motion. No thyromegaly present.  Cardiovascular: Normal rate, regular rhythm and normal heart sounds.   No murmur heard. Pulses:      Dorsalis pedis pulses are 2+ on the right side, and 2+ on the left side.  Pulmonary/Chest: Effort normal. He has no wheezes. He has no rales.  Abdominal: Soft. He exhibits no mass. There is no hepatosplenomegaly. There is no tenderness.  Genitourinary:  Refused exam,no concerns.  Musculoskeletal: He exhibits no edema.  No sign of synovitis or deformities apprecited.  Lymphadenopathy:    He has no cervical adenopathy.       Right: No supraclavicular adenopathy present.       Left: No supraclavicular adenopathy present.  Neurological: He is alert and oriented to person, place, and time. He has normal strength. No cranial nerve deficit or sensory deficit. Coordination and gait normal.  Reflex Scores:      Bicep reflexes are 2+ on the right side and 2+ on the left side.      Patellar reflexes are 2+ on the right side and 2+ on the left side. Skin: Skin is warm.  Psychiatric: He has a normal mood and affect.  Well groomed, good eye contact.       Assessment & Plan:   Diagnoses and all orders for this visit:  Routine physical examination  Preventive guidelines discussed. Testicular self-exam periodically recommended. Benefits of a healthy life style for primary and secondary prevention discussed. Next CPE in a year.  -     Hemoglobin A1c  Essential hypertension, benign  Slightly elevated DBP, resumed medication 2 days ago. No changes in current management. DASH diet recommended. Eye exam recommended annually. F/U in 6 months, before if needed.   -     lisinopril-hydrochlorothiazide (PRINZIDE,ZESTORETIC) 20-12.5 MG tablet; Take 1  tablet by mouth daily. -     Comprehensive metabolic panel  Hyperlipidemia  Low fat diet to continue. I will follow labs done today and will give further recommendations accordingly.  -     Lipid Panel -     Comprehensive metabolic panel  BMI 37.0-37.9, adult  We discussed benefits of wt loss as well as adverse effects of obesity. Consistency with healthy diet and physical activity recommended. Weight Watchers is a good option as well as daily brisk walking for 15-30 min as tolerated.  -     Comprehensive metabolic panel  Diabetes mellitus screening  +risk factors: FHx, Hispanic,HTN, and obesity.  -     Hemoglobin A1c      -Patient advised to return if new concerns arise.     Jahara Dail G. SwazilandJordan, MD  [facility]. [place] office.

## 2016-03-17 ENCOUNTER — Encounter: Payer: Self-pay | Admitting: Family Medicine

## 2016-08-03 NOTE — Progress Notes (Signed)
Rodney Beltran D.O. [facility] [address] [place], [place] [address] Phone: [phone] Subjective:    I'm seeing this patient by the request  of:  Betty SwazilandJordan, MD   CC: Left heel pain  BJY:NWGNFAOZHYHPI:Subjective  Rodney RacerJoshua Evas Beltran is a 40 y.o. male coming in with complaint of left heel pain. Patient states that this started approximately 2 months ago. Does not rib number any injury. Seems to be on the lateral aspect the ankle and heel. Patient states it was very more with walking. Seem to get better with rest. Patient did get new shoes that seemed to aggravate it at first but now has improved it somewhat. Patient denies any swelling, numbness.     Past Medical History:  Diagnosis Date  . Hyperlipidemia   . Hypertension    Past Surgical History:  Procedure Laterality Date  . VASECTOMY     Social History   Social History  . Marital status: Married    Spouse name: N/A  . Number of children: N/A  . Years of education: N/A   Social History Main Topics  . Smoking status: Never Smoker  . Smokeless tobacco: None  . Alcohol use No  . Drug use: No  . Sexual activity: Yes   Other Topics Concern  . None   Social History Narrative  . None   No Known Allergies Family History  Problem Relation Age of Onset  . Lupus Mother   . Mental illness Maternal Aunt   . Diabetes Maternal Aunt   . Diabetes Maternal Uncle   . Diabetes Maternal Grandmother   . Hypertension Maternal Grandmother   . Hyperlipidemia Maternal Grandmother   . Mental illness Maternal Grandfather     Past medical history, social, surgical and family history all reviewed in electronic medical record.  No pertanent information unless stated regarding to the chief complaint.   Review of Systems: No headache, visual changes, nausea, vomiting, diarrhea, constipation, dizziness, abdominal pain, skin rash, fevers, chills, night sweats, weight loss, swollen lymph nodes, body aches, joint swelling, muscle  aches, chest pain, shortness of breath, mood changes.   Objective  Blood pressure 132/86, pulse 82, weight 243 lb (110.2 kg), SpO2 95 %.  General: No apparent distress alert and oriented x3 mood and affect normal, dressed appropriately.  HEENT: Pupils equal, extraocular movements intact  Respiratory: Patient's speak in full sentences and does not appear short of breath  Cardiovascular: No lower extremity edema, non tender, no erythema  Skin: Warm dry intact with no signs of infection or rash on extremities or on axial skeleton.  Abdomen: Soft nontender  Neuro: Cranial nerves II through XII are intact, neurovascularly intact in all extremities with 2+ DTRs and 2+ pulses.  Lymph: No lymphadenopathy of posterior or anterior cervical chain or axillae bilaterally.  Gait normal with good balance and coordination.  MSK:  Non tender with full range of motion and good stability and symmetric strength and tone of shoulders, elbows, wrist, hip, knee and bilaterally.  Ankle: Left No visible erythema or swelling. Range of motion is full in all directions. Strength is 5/5 in all directions. Stable lateral and medial ligaments; squeeze test and kleiger test unremarkable; Pain over the lateral aspect of the ankle. Able to walk 4 steps.  Limited musculoskeletal ultrasound was performed and interpreted by Judi SaaZachary M Beltran  Limited nausea skeletal ultrasound of the ankle shows hypoechoic changes as well as subluxation of the peroneal tendons. No increase in Doppler flow were no intermittent  hypoechoic changes. Achilles is intact. Plantar fascial is normal. Impression: Peroneal subluxation  Procedure note 97110; 15 minutes spent for Therapeutic exercises as stated in above notes.  This included exercises focusing on stretching, strengthening, with significant focus on eccentric aspects. Ankle strengthening that included:  Basic range of motion exercises to allow proper full motion at ankle Stretching of the  lower leg and hamstrings  Theraband exercises for the lower leg - inversion, eversion, dorsiflexion and plantarflexion each to be completed with a theraband Balance exercises to increase proprioception Weight bearing exercises to increase strength and balance   Proper technique shown and discussed handout in great detail with ATC.  All questions were discussed and answered.     Impression and Recommendations:     This case required medical decision making of moderate complexity.      Note: This dictation was prepared with Dragon dictation along with smaller phrase technology. Any transcriptional errors that result from this process are unintentional.

## 2016-08-04 ENCOUNTER — Ambulatory Visit (INDEPENDENT_AMBULATORY_CARE_PROVIDER_SITE_OTHER): Payer: Managed Care, Other (non HMO) | Admitting: Family Medicine

## 2016-08-04 ENCOUNTER — Encounter: Payer: Self-pay | Admitting: Family Medicine

## 2016-08-04 ENCOUNTER — Other Ambulatory Visit: Payer: Self-pay

## 2016-08-04 VITALS — BP 132/86 | HR 82 | Wt 243.0 lb

## 2016-08-04 DIAGNOSIS — S93332A Other subluxation of left foot, initial encounter: Secondary | ICD-10-CM | POA: Insufficient documentation

## 2016-08-04 DIAGNOSIS — IMO0001 Reserved for inherently not codable concepts without codable children: Secondary | ICD-10-CM

## 2016-08-04 DIAGNOSIS — M79672 Pain in left foot: Secondary | ICD-10-CM

## 2016-08-04 DIAGNOSIS — S9305XA Dislocation of left ankle joint, initial encounter: Secondary | ICD-10-CM | POA: Diagnosis not present

## 2016-08-04 MED ORDER — DICLOFENAC SODIUM 2 % TD SOLN: 2.0000 "application " | Freq: Two times a day (BID) | TRANSDERMAL | 3 refills | Status: DC

## 2016-08-04 NOTE — Patient Instructions (Signed)
Good to see you.  Ice bath 10 minutes at night pennsaid pinkie amount topically 2 times daily as needed.  Good shoes with a rigid bottom  Heel lift in the shoe can help Losing weight can help See me again in 4 weeks if not resolved.

## 2016-08-04 NOTE — Assessment & Plan Note (Signed)
Patient doesn't more of a peroneal subluxation. We discussed icing regimen and home exercises. We discussed which activities to do an which was to potentially avoid. Patient will continue to stay active. Work with Event organiserathletic trainer to learn home exercises in greater detail. Topical anti-inflammatory's prescribed. Follow-up again in 3-4 weeks.

## 2016-09-02 ENCOUNTER — Ambulatory Visit: Payer: Managed Care, Other (non HMO) | Admitting: Family Medicine

## 2016-09-12 ENCOUNTER — Other Ambulatory Visit: Payer: Self-pay | Admitting: Family Medicine

## 2016-09-12 DIAGNOSIS — I10 Essential (primary) hypertension: Secondary | ICD-10-CM

## 2016-09-17 ENCOUNTER — Ambulatory Visit: Payer: Managed Care, Other (non HMO) | Admitting: Family Medicine

## 2016-12-11 ENCOUNTER — Other Ambulatory Visit: Payer: Self-pay | Admitting: Family Medicine

## 2016-12-11 DIAGNOSIS — I10 Essential (primary) hypertension: Secondary | ICD-10-CM

## 2017-03-13 ENCOUNTER — Other Ambulatory Visit: Payer: Self-pay | Admitting: Family Medicine

## 2017-03-13 DIAGNOSIS — I10 Essential (primary) hypertension: Secondary | ICD-10-CM

## 2017-06-14 ENCOUNTER — Other Ambulatory Visit: Payer: Self-pay | Admitting: Family Medicine

## 2017-06-14 DIAGNOSIS — I10 Essential (primary) hypertension: Secondary | ICD-10-CM

## 2017-07-28 ENCOUNTER — Encounter: Payer: Self-pay | Admitting: Family Medicine

## 2017-11-14 ENCOUNTER — Other Ambulatory Visit: Payer: Self-pay | Admitting: Family Medicine

## 2017-11-14 DIAGNOSIS — I10 Essential (primary) hypertension: Secondary | ICD-10-CM

## 2017-11-15 NOTE — Telephone Encounter (Signed)
Spoke with patient, informed him that he needed to be seen due to last office visit being in May 2017. Patient verbalized understanding. Patient stated that he does not have insurance due to no job and that he cannot afford to come in for an office visit. Last refill 06/2017

## 2017-12-05 ENCOUNTER — Ambulatory Visit: Payer: No Typology Code available for payment source | Admitting: Family Medicine

## 2017-12-05 ENCOUNTER — Encounter: Payer: Self-pay | Admitting: Family Medicine

## 2017-12-05 VITALS — BP 120/72 | HR 75 | Temp 98.2°F | Resp 12 | Ht 66.0 in | Wt 235.0 lb

## 2017-12-05 DIAGNOSIS — I10 Essential (primary) hypertension: Secondary | ICD-10-CM | POA: Diagnosis not present

## 2017-12-05 DIAGNOSIS — Z6837 Body mass index (BMI) 37.0-37.9, adult: Secondary | ICD-10-CM | POA: Diagnosis not present

## 2017-12-05 LAB — BASIC METABOLIC PANEL
BUN: 21 mg/dL (ref 6–23)
CHLORIDE: 100 meq/L (ref 96–112)
CO2: 29 meq/L (ref 19–32)
Calcium: 9.9 mg/dL (ref 8.4–10.5)
Creatinine, Ser: 1.2 mg/dL (ref 0.40–1.50)
GFR: 70.7 mL/min (ref >60.00)
GLUCOSE: 100 mg/dL — AB (ref 70–99)
POTASSIUM: 3.9 meq/L (ref 3.5–5.1)
Sodium: 140 mEq/L (ref 135–145)

## 2017-12-05 MED ORDER — LISINOPRIL-HYDROCHLOROTHIAZIDE 20-12.5 MG PO TABS: 1.0000 | ORAL_TABLET | Freq: Every day | ORAL | 1 refills | Status: DC

## 2017-12-05 NOTE — Patient Instructions (Signed)
A few things to remember from today's visit:   Essential hypertension, benign - Plan: Basic metabolic panel, lisinopril-hydrochlorothiazide (PRINZIDE,ZESTORETIC) 20-12.5 MG tablet  No changes today.   Please be sure medication list is accurate. If a new problem present, please set up appointment sooner than planned today.

## 2017-12-05 NOTE — Progress Notes (Signed)
HPI:   [name] is a 42 y.o. male, who is here today for 6 months follow up.   He was last seen on 03/16/2016.  Since his last OV he has followed with Sport Medicine for left heel pain.    HTN: Dx 5+ years ago. Home BP readings: < 130/80's.  Denies severe/frequent headache, visual changes, chest pain, dyspnea, palpitation, claudication, focal weakness, or edema.  He is on Lisinopril-HCTZ 20-12.5 mg daily.  Obesity: He has not been consistent with a healthy diet and regular physical activity. When he has engaged in restricted diet in the past,he has lost wt. He still eats out frequently but trying to make healthier choices.    URI: He was evaluated in the ER recently because a "bad cold." 2 days of abx treatment,he was Dx with sinus infection on 12/03/17 and started abx yesterday. Non productive cough. No fever.  His daughter started with high fever today.    Review of Systems  Constitutional: Positive for fatigue. Negative for activity change, appetite change and fever.  HENT: Positive for congestion, postnasal drip and rhinorrhea. Negative for nosebleeds, sore throat and trouble swallowing.   Eyes: Negative for redness and visual disturbance.  Respiratory: Positive for cough. Negative for shortness of breath and wheezing.   Cardiovascular: Negative for chest pain, palpitations and leg swelling.  Gastrointestinal: Negative for abdominal pain, nausea and vomiting.  Endocrine: Negative for cold intolerance, heat intolerance, polydipsia, polyphagia and polyuria.  Genitourinary: Negative for decreased urine volume, dysuria and hematuria.  Musculoskeletal: Negative for gait problem and myalgias.  Skin: Negative for rash.  Neurological: Negative for syncope, weakness and headaches.  Psychiatric/Behavioral: Negative for confusion. The patient is not nervous/anxious.      No current outpatient medications on file prior to visit.   No current  facility-administered medications on file prior to visit.      Past Medical History:  Diagnosis Date  . Hyperlipidemia   . Hypertension    No Known Allergies  Social History   Socioeconomic History  . Marital status: Married    Spouse name: None  . Number of children: None  . Years of education: None  . Highest education level: None  Social Needs  . Financial resource strain: None  . Food insecurity - worry: None  . Food insecurity - inability: None  . Transportation needs - medical: None  . Transportation needs - non-medical: None  Occupational History  . None  Tobacco Use  . Smoking status: Never Smoker  . Smokeless tobacco: Never Used  Substance and Sexual Activity  . Alcohol use: No  . Drug use: No  . Sexual activity: Yes  Other Topics Concern  . None  Social History Narrative  . None    Vitals:   12/05/17 1441  BP: 120/72  Pulse: 75  Resp: 12  Temp: 98.2 F (36.8 C)  SpO2: 96%   Body mass index is 37.93 kg/m.    Physical Exam  Nursing note and vitals reviewed. Constitutional: He is oriented to person, place, and time. He appears well-developed. No distress.  HENT:  Head: Normocephalic and atraumatic.  Mouth/Throat: Oropharynx is clear and moist and mucous membranes are normal.  Eyes: Conjunctivae are normal. Pupils are equal, round, and reactive to light.  Cardiovascular: Normal rate and regular rhythm.  No murmur heard. Pulses:      Dorsalis pedis pulses are 2+ on the right side, and 2+ on the left side.  Respiratory: Effort  normal and breath sounds normal. No respiratory distress.  GI: Soft. He exhibits no mass. There is no hepatomegaly. There is no tenderness.  Musculoskeletal: He exhibits no edema.  Lymphadenopathy:    He has no cervical adenopathy.  Neurological: He is alert and oriented to person, place, and time. He has normal strength.  Skin: Skin is warm. No erythema.  Psychiatric: He has a normal mood and affect. Cognition and  memory are normal.  Well groomed, good eye contact.    ASSESSMENT AND PLAN:   [name] was seen today for 6 months follow-up.   Diagnoses and all orders for this visit:  Lab Results  Component Value Date   CREATININE 1.20 12/05/2017   BUN 21 12/05/2017   NA 140 12/05/2017   K 3.9 12/05/2017   CL 100 12/05/2017   CO2 29 12/05/2017    Essential hypertension, benign  Adequately controlled. No changes in current management. DASH-low salt diet recommended. Eye exam recommended annually. F/U in 6 months, before if needed.  -     Basic metabolic panel -     lisinopril-hydrochlorothiazide (PRINZIDE,ZESTORETIC) 20-12.5 MG tablet; Take 1 tablet by mouth daily.  BMI 37.0-37.9, adult  We discussed benefits of wt loss as well as adverse effects of obesity. Consistency with healthy diet and physical activity recommended.       -[name] was advised to return sooner than planned today if new concerns arise.       Betty G. [place], MD  [facility]. [place] office.

## 2018-07-03 ENCOUNTER — Other Ambulatory Visit: Payer: Self-pay | Admitting: Family Medicine

## 2018-07-03 DIAGNOSIS — I10 Essential (primary) hypertension: Secondary | ICD-10-CM

## 2018-07-04 NOTE — Telephone Encounter (Signed)
Left message to call office for appointment for medication refill.

## 2018-07-04 NOTE — Telephone Encounter (Signed)
Copied from CRM [phone]#[id number]. Topic: General - Other >> Jul 04, 2018  9:58 AM Tamela OddiHarris, Brenda J wrote: Reason for CRM: Patient called to request a refill on a few of his BP medication, lisinopril-hydrochlorothiazide (PRINZIDE,ZESTORETIC) 20-12.5 MG tablet.  Patient stated that he has pills at home but he forgot to bring them with him when he went out of town.  He would like 5 or 6 pills to cover him until he returns home.  Please advise.  CB#[phone].

## 2018-08-16 ENCOUNTER — Ambulatory Visit: Payer: No Typology Code available for payment source | Admitting: Family Medicine

## 2018-08-17 NOTE — Progress Notes (Signed)
[name] is a 42 y.o.male, who is here today for his routine CPE and to follow on HTN,HLD,and obesity. He was last seen on 12/05/17.  Last CPE in 03/2016.  He lives with his wife and 3 daughters.   Immunization History  Administered Date(s) Administered  . Tdap 11/08/2014  . Yellow Fever 11/08/2014   Denies dysuria,increased urinary frequency, gross hematuria,or decreased urine output.  Due to work schedule it is hard for him to be consistent with regular physical activity as well as a healthy diet, he travels frequently. He is planning on taking some time of from mid December to end of January 2020.   HTN: He was on lisinopril-HCTZ 20-12.5 mg daily and 2.5 weeks ago, when he ran out of medication. He has not been checking BP at home. No side effects reported.  He has not noted unusual headache, visual changes, exertional chest pain, dyspnea,  focal weakness, or edema.  Due to work schedule it is hard for him to follow every 6 months.  Lab Results  Component Value Date   CREATININE 1.20 12/05/2017   BUN 21 12/05/2017   NA 140 12/05/2017   K 3.9 12/05/2017   CL 100 12/05/2017   CO2 29 12/05/2017    Hyperlipidemia:  Currently on non pharmacologic diet. Following a low fat diet: Yes but not consistently.  Lab Results  Component Value Date   CHOL 239 (H) 03/16/2016   HDL 42.90 03/16/2016   LDLCALC 167 (H) 03/16/2016   TRIG 146.0 03/16/2016   CHOLHDL 6 03/16/2016    Obesity:  Dietary changes since last OV: He has followed keto diet but not consistently. Exercise: None.   Review of Systems  Constitutional: Negative for activity change, appetite change, fatigue and fever.  HENT: Negative for nosebleeds, sore throat and trouble swallowing.   Eyes: Negative for redness and visual disturbance.  Respiratory: Negative for cough, shortness of breath and wheezing.   Cardiovascular: Negative for chest pain, palpitations and leg swelling.    Gastrointestinal: Negative for abdominal pain, nausea and vomiting.  Endocrine: Negative for cold intolerance, heat intolerance, polydipsia, polyphagia and polyuria.  Genitourinary: Negative for decreased urine volume, frequency and hematuria.  Musculoskeletal: Negative for gait problem and myalgias.  Skin: Negative for rash.  Allergic/Immunologic: Negative for environmental allergies.  Neurological: Negative for dizziness, syncope, weakness and headaches.  Hematological: Negative for adenopathy.  Psychiatric/Behavioral: Negative for sleep disturbance. The patient is not nervous/anxious.   All other systems reviewed and are negative.    No current outpatient medications on file prior to visit.   No current facility-administered medications on file prior to visit.      Past Medical History:  Diagnosis Date  . Hyperlipidemia   . Hypertension     No Known Allergies  Social History   Socioeconomic History  . Marital status: Married    Spouse name: Not on file  . Number of children: Not on file  . Years of education: Not on file  . Highest education level: Not on file  Occupational History  . Not on file  Social Needs  . Financial resource strain: Not on file  . Food insecurity:    Worry: Not on file    Inability: Not on file  . Transportation needs:    Medical: Not on file    Non-medical: Not on file  Tobacco Use  . Smoking status: Never Smoker  . Smokeless tobacco: Never Used  Substance and Sexual Activity  .  Alcohol use: No  . Drug use: No  . Sexual activity: Yes  Lifestyle  . Physical activity:    Days per week: Not on file    Minutes per session: Not on file  . Stress: Not on file  Relationships  . Social connections:    Talks on phone: Not on file    Gets together: Not on file    Attends religious service: Not on file    Active member of club or organization: Not on file    Attends meetings of clubs or organizations: Not on file    Relationship  status: Not on file  Other Topics Concern  . Not on file  Social History Narrative  . Not on file    Vitals:   08/18/18 0713 08/18/18 0739  BP: 124/82 (!) 140/100  Pulse: 71   Resp: 12   Temp: 98.4 F (36.9 C)   SpO2: 97%    Body mass index is 38.49 kg/m.    Physical Exam  Nursing note and vitals reviewed. Constitutional: He is oriented to person, place, and time. He appears well-developed. No distress.  HENT:  Head: Normocephalic and atraumatic.  Right Ear: Tympanic membrane, external ear and ear canal normal.  Left Ear: Tympanic membrane, external ear and ear canal normal.  Mouth/Throat: Oropharynx is clear and moist and mucous membranes are normal.  Eyes: Pupils are equal, round, and reactive to light. Conjunctivae and EOM are normal.  Neck: Normal range of motion. No tracheal deviation present. No thyromegaly present.  Cardiovascular: Normal rate and regular rhythm.  No murmur heard. Pulses:      Dorsalis pedis pulses are 2+ on the right side, and 2+ on the left side.  Respiratory: Effort normal and breath sounds normal. No respiratory distress.  GI: Soft. He exhibits no mass. There is no hepatomegaly. There is no tenderness.  Genitourinary:  Genitourinary Comments: Refused,no concerns.  Musculoskeletal: He exhibits no edema or tenderness.  No major deformities appreciated and no signs of synovitis.  Lymphadenopathy:    He has no cervical adenopathy.       Right: No supraclavicular adenopathy present.       Left: No supraclavicular adenopathy present.  Neurological: He is alert and oriented to person, place, and time. He has normal strength. No cranial nerve deficit or sensory deficit. Coordination and gait normal.  Reflex Scores:      Bicep reflexes are 2+ on the right side and 2+ on the left side.      Patellar reflexes are 2+ on the right side and 2+ on the left side. Skin: Skin is warm. No rash noted. No erythema.  Psychiatric: He has a normal mood and  affect. Cognition and memory are normal.  Well groomed, good eye contact.    ASSESSMENT AND PLAN:    [name] is here today for CPE and HTN follow up.  Orders Placed This Encounter  Procedures  . Comprehensive metabolic panel  . Lipid panel  . Hemoglobin A1c   Lab Results  Component Value Date   CHOL 256 (H) 08/18/2018   HDL 44.20 08/18/2018   LDLCALC 186 (H) 08/18/2018   TRIG 131.0 08/18/2018   CHOLHDL 6 08/18/2018   Lab Results  Component Value Date   ALT 32 08/18/2018   AST 21 08/18/2018   ALKPHOS 59 08/18/2018   BILITOT 0.5 08/18/2018   Lab Results  Component Value Date   CREATININE 1.02 08/18/2018   BUN 18 08/18/2018  NA 140 08/18/2018   K 4.0 08/18/2018   CL 102 08/18/2018   CO2 30 08/18/2018   Routine general medical examination at a health care facility We discussed the importance of regular physical activity and healthy diet for prevention of chronic illness and/or complications. Preventive guidelines reviewed. Vaccination up to date.  Next CPE in a year.   The 10-year ASCVD risk score Denman George DC Montez Hageman., et al., 2013) is: 3.7%   Values used to calculate the score:     Age: 6 years     Sex: Male     Is Non-Hispanic African American: No     Diabetic: No     Tobacco smoker: No     Systolic Blood Pressure: 140 mmHg     Is BP treated: Yes     HDL Cholesterol: 44.2 mg/dL     Total Cholesterol: 256 mg/dL    Essential hypertension, benign Not well controlled. Possible complications of elevated BP discussed. She will resume lisinopril-HCTZ 20-12.5 mg daily. Recommend monitoring BP at home. Annual eye examination. Instructed about warning signs. Because work schedule, frequent traveling, he would like to follow annually.   Hyperlipidemia For now he will continue on nonpharmacologic treatment. Further recommendations will be given according to lipid panel results as well as 10 years CVD risk.  Severe obesity (BMI 35.0-39.9)  with comorbidity (HCC) We discussed benefits of wt loss as well as adverse effects of obesity. Consistency with healthy diet and physical activity recommended. We discussed some adverse effects of keto diet, recommend not to do it for over 3 months at the time.    Diabetes mellitus screening -     Comprehensive metabolic panel -     Hemoglobin A1c      Betty G. [place], MD  [facility]. [place] office.

## 2018-08-18 ENCOUNTER — Ambulatory Visit (INDEPENDENT_AMBULATORY_CARE_PROVIDER_SITE_OTHER): Payer: 59 | Admitting: Family Medicine

## 2018-08-18 ENCOUNTER — Encounter: Payer: Self-pay | Admitting: Family Medicine

## 2018-08-18 VITALS — BP 140/100 | HR 71 | Temp 98.4°F | Resp 12 | Ht 66.0 in | Wt 238.5 lb

## 2018-08-18 DIAGNOSIS — E785 Hyperlipidemia, unspecified: Secondary | ICD-10-CM | POA: Diagnosis not present

## 2018-08-18 DIAGNOSIS — Z0000 Encounter for general adult medical examination without abnormal findings: Secondary | ICD-10-CM | POA: Diagnosis not present

## 2018-08-18 DIAGNOSIS — Z131 Encounter for screening for diabetes mellitus: Secondary | ICD-10-CM | POA: Diagnosis not present

## 2018-08-18 DIAGNOSIS — I10 Essential (primary) hypertension: Secondary | ICD-10-CM | POA: Diagnosis not present

## 2018-08-18 LAB — COMPREHENSIVE METABOLIC PANEL
ALK PHOS: 59 U/L (ref 39–117)
ALT: 32 U/L (ref 0–53)
AST: 21 U/L (ref 0–37)
Albumin: 4.5 g/dL (ref 3.5–5.2)
BUN: 18 mg/dL (ref 6–23)
CALCIUM: 9.5 mg/dL (ref 8.4–10.5)
CO2: 30 meq/L (ref 19–32)
Chloride: 102 mEq/L (ref 96–112)
Creatinine, Ser: 1.02 mg/dL (ref 0.40–1.50)
GFR: 84.99 mL/min (ref >60.00)
GLUCOSE: 109 mg/dL — AB (ref 70–99)
POTASSIUM: 4 meq/L (ref 3.5–5.1)
Sodium: 140 mEq/L (ref 135–145)
Total Bilirubin: 0.5 mg/dL (ref 0.2–1.2)
Total Protein: 7.1 g/dL (ref 6.0–8.3)

## 2018-08-18 LAB — LIPID PANEL
Cholesterol: 256 mg/dL — ABNORMAL HIGH (ref 0–200)
HDL: 44.2 mg/dL (ref >39.00)
LDL CALC: 186 mg/dL — AB (ref 0–99)
NonHDL: 212.13
TRIGLYCERIDES: 131 mg/dL (ref 0.0–149.0)
Total CHOL/HDL Ratio: 6
VLDL: 26.2 mg/dL (ref 0.0–40.0)

## 2018-08-18 LAB — HEMOGLOBIN A1C: HEMOGLOBIN A1C: 5.8 % (ref 4.6–6.5)

## 2018-08-18 MED ORDER — LISINOPRIL-HYDROCHLOROTHIAZIDE 20-12.5 MG PO TABS: ORAL_TABLET | ORAL | 3 refills | Status: DC

## 2018-08-18 NOTE — Assessment & Plan Note (Signed)
Not well controlled. Possible complications of elevated BP discussed. She will resume lisinopril-HCTZ 20-12.5 mg daily. Recommend monitoring BP at home. Annual eye examination. Instructed about warning signs. Because work schedule, frequent traveling, he would like to follow annually.

## 2018-08-18 NOTE — Assessment & Plan Note (Signed)
We discussed benefits of wt loss as well as adverse effects of obesity. Consistency with healthy diet and physical activity recommended. We discussed some adverse effects of keto diet, recommend not to do it for over 3 months at the time.

## 2018-08-18 NOTE — Patient Instructions (Addendum)
A few things to remember from today's visit:   Routine general medical examination at a health care facility  Essential hypertension, benign - Plan: lisinopril-hydrochlorothiazide (PRINZIDE,ZESTORETIC) 20-12.5 MG tablet  Hyperlipidemia, unspecified hyperlipidemia type - Plan: Lipid panel  Severe obesity (BMI 35.0-39.9) with comorbidity (HCC)  Diabetes mellitus screening - Plan: Comprehensive metabolic panel, Hemoglobin A1c   At least 150 minutes of moderate exercise per week, daily brisk walking for 15-30 min is a good exercise option. Healthy diet low in saturated (animal) fats and sweets and consisting of fresh fruits and vegetables, lean meats such as fish and white chicken and whole grains.  - Vaccines:  Tdap vaccine every 10 years.  Shingles vaccine recommended at age 48, could be given after 42 years of age but not sure about insurance coverage.  Pneumonia vaccines:  Prevnar 13 at 65 and Pneumovax at 63.   -Screening recommendations for low/normal risk males:  Screening for diabetes at age 74 and every 3 years. Earlier screening if cardiovascular risk factors.     Colon cancer screening at age 55 and until age 29.  Prostate cancer screening: some controversy, starts usually at 50: Rectal exam and PSA.  Aortic Abdominal Aneurism once between 39 and 6 years old if ever smoker.  Also recommended:  1. Dental visit- Brush and floss your teeth twice daily; visit your dentist twice a year. 2. Eye doctor- Get an eye exam at least every 2 years. 3. Helmet use- Always wear a helmet when riding a bicycle, motorcycle, rollerblading or skateboarding. 4. Safe sex- If you may be exposed to sexually transmitted infections, use a condom. 5. Seat belts- Seat belts can save your live; always wear one. 6. Smoke/Carbon Monoxide detectors- These detectors need to be installed on the appropriate level of your home. Replace batteries at least once a year. 7. Skin cancer- When out in the  sun please cover up and use sunscreen 15 SPF or higher. 8. Violence- If anyone is threatening or hurting you, please tell your healthcare provider.  9. Drink alcohol in moderation- Limit alcohol intake to one drink or less per day. Never drink and drive.  Please be sure medication list is accurate. If a new problem present, please set up appointment sooner than planned today.

## 2018-08-18 NOTE — Assessment & Plan Note (Signed)
For now he will continue on nonpharmacologic treatment. Further recommendations will be given according to lipid panel results as well as 10 years CVD risk.

## 2018-08-21 ENCOUNTER — Encounter: Payer: Self-pay | Admitting: Family Medicine

## 2018-08-22 ENCOUNTER — Other Ambulatory Visit: Payer: Self-pay | Admitting: *Deleted

## 2019-03-07 DIAGNOSIS — M48061 Spinal stenosis, lumbar region without neurogenic claudication: Secondary | ICD-10-CM | POA: Diagnosis not present

## 2019-03-14 DIAGNOSIS — M5117 Intervertebral disc disorders with radiculopathy, lumbosacral region: Secondary | ICD-10-CM | POA: Diagnosis not present

## 2019-09-10 ENCOUNTER — Telehealth: Payer: Self-pay | Admitting: Family Medicine

## 2019-09-10 NOTE — Telephone Encounter (Signed)
Pt made an appt for CPE on Friday 11/6 but BP is running a little high 140/90 146/97 so he has taking thinking it should be stronger BP med, wondering if you want to go ahead and call him meds in before the appt or if he should just up dosage, wait to see you? [phone]   [facility] [address] - [place], [place] - [address]  [address] [place] [address]  Phone: [phone] Fax: [phone]  Not a 24 hour pharmacy; exact hours not known.   lisinopril-hydrochlorothiazide (PRINZIDE,ZESTORETIC) 20-12.5 MG tablet

## 2019-09-11 NOTE — Telephone Encounter (Signed)
Last seen over a year ago. Can we please arrange a f/u appt. Thanks, BJ

## 2019-09-14 ENCOUNTER — Ambulatory Visit (INDEPENDENT_AMBULATORY_CARE_PROVIDER_SITE_OTHER): Payer: 59 | Admitting: Family Medicine

## 2019-09-14 ENCOUNTER — Other Ambulatory Visit: Payer: Self-pay

## 2019-09-14 ENCOUNTER — Encounter: Payer: Self-pay | Admitting: Family Medicine

## 2019-09-14 VITALS — BP 120/68 | HR 71 | Temp 97.3°F | Resp 16 | Ht 66.0 in | Wt 228.6 lb

## 2019-09-14 DIAGNOSIS — Z1329 Encounter for screening for other suspected endocrine disorder: Secondary | ICD-10-CM | POA: Diagnosis not present

## 2019-09-14 DIAGNOSIS — N529 Male erectile dysfunction, unspecified: Secondary | ICD-10-CM | POA: Diagnosis not present

## 2019-09-14 DIAGNOSIS — E785 Hyperlipidemia, unspecified: Secondary | ICD-10-CM

## 2019-09-14 DIAGNOSIS — Z0000 Encounter for general adult medical examination without abnormal findings: Secondary | ICD-10-CM | POA: Diagnosis not present

## 2019-09-14 DIAGNOSIS — I10 Essential (primary) hypertension: Secondary | ICD-10-CM

## 2019-09-14 DIAGNOSIS — Z13228 Encounter for screening for other metabolic disorders: Secondary | ICD-10-CM | POA: Diagnosis not present

## 2019-09-14 DIAGNOSIS — Z130 Encounter for screening for diseases of the blood and blood-forming organs and certain disorders involving the immune mechanism: Secondary | ICD-10-CM

## 2019-09-14 LAB — COMPREHENSIVE METABOLIC PANEL
ALT: 22 U/L (ref 0–53)
AST: 18 U/L (ref 0–37)
Albumin: 4.7 g/dL (ref 3.5–5.2)
Alkaline Phosphatase: 61 U/L (ref 39–117)
BUN: 17 mg/dL (ref 6–23)
CO2: 28 mEq/L (ref 19–32)
Calcium: 9.7 mg/dL (ref 8.4–10.5)
Chloride: 103 mEq/L (ref 96–112)
Creatinine, Ser: 1.01 mg/dL (ref 0.40–1.50)
GFR: 80.47 mL/min (ref >60.00)
Glucose, Bld: 109 mg/dL — ABNORMAL HIGH (ref 70–99)
Potassium: 4.1 mEq/L (ref 3.5–5.1)
Sodium: 139 mEq/L (ref 135–145)
Total Bilirubin: 0.4 mg/dL (ref 0.2–1.2)
Total Protein: 7.1 g/dL (ref 6.0–8.3)

## 2019-09-14 LAB — LIPID PANEL
Cholesterol: 284 mg/dL — ABNORMAL HIGH (ref 0–200)
HDL: 46.7 mg/dL (ref >39.00)
LDL Cholesterol: 223 mg/dL — ABNORMAL HIGH (ref 0–99)
NonHDL: 236.95
Total CHOL/HDL Ratio: 6
Triglycerides: 72 mg/dL (ref 0.0–149.0)
VLDL: 14.4 mg/dL (ref 0.0–40.0)

## 2019-09-14 LAB — TSH: TSH: 1.24 u[IU]/mL (ref 0.35–4.50)

## 2019-09-14 LAB — TESTOSTERONE: Testosterone: 317.29 ng/dL (ref 300.00–890.00)

## 2019-09-14 LAB — HEMOGLOBIN A1C: Hgb A1c MFr Bld: 5.8 % (ref 4.6–6.5)

## 2019-09-14 MED ORDER — SILDENAFIL CITRATE 20 MG PO TABS: 40.0000 mg | ORAL_TABLET | Freq: Every day | ORAL | 1 refills | Status: DC | PRN

## 2019-09-14 MED ORDER — AMLODIPINE BESYLATE 2.5 MG PO TABS: 2.5000 mg | ORAL_TABLET | Freq: Every day | ORAL | 1 refills | Status: DC

## 2019-09-14 MED ORDER — LISINOPRIL-HYDROCHLOROTHIAZIDE 20-12.5 MG PO TABS: ORAL_TABLET | ORAL | 1 refills | Status: DC

## 2019-09-14 NOTE — Progress Notes (Signed)
HPI:  [name] is a 43 y.o.male here today for his routine physical examination and follow up.  Last CPE: 08/18/18 He lives with his wife and 3 daughters.  Regular exercise 3 or more times per week: Not consistently  Following a healthy diet: He is doing Keto diet since 04/2019.  He is working from home. Chronic medical problems: Back pain with radiculopathy,HTN,HLD,obesity.  Hx of STD's: Negative.  Immunization History  Administered Date(s) Administered  . Tdap 11/08/2014  . Yellow Fever 11/08/2014    -Denies high alcohol intake, tobacco use, or Hx of illicit drug use.  -Concerns and/or follow up today:   HTN: BP readings have been elevated, 130's-140's/80's. He has not been consistent with taking medication,doing better for the past few weeks. He is following low salt diet.  He has not noted headache,CP,or edema.  ED: C/O having difficulties with maintaining an erection for the past 8-9 months. Denies depressed mood,fatigue,or decreased libido. No genital lesions,urinary symptoms,or anatomical abnormalities. He has not taken medications for treatment.   Review of Systems  Constitutional: Negative for activity change, appetite change, fever and unexpected weight change.  HENT: Negative for dental problem, nosebleeds, sore throat, trouble swallowing and voice change.   Eyes: Negative for redness and visual disturbance.  Respiratory: Negative for cough, shortness of breath and wheezing.   Cardiovascular: Negative for palpitations.  Gastrointestinal: Negative for abdominal pain, blood in stool, nausea and vomiting.  Endocrine: Negative for cold intolerance, heat intolerance, polydipsia, polyphagia and polyuria.  Genitourinary: Negative for decreased urine volume, dysuria, hematuria and testicular pain.  Musculoskeletal: Positive for back pain. Negative for gait problem.  Skin: Negative for color change and rash.  Neurological: Negative for  dizziness, syncope and weakness.  Hematological: Negative for adenopathy. Does not bruise/bleed easily.  Psychiatric/Behavioral: Negative for confusion. The patient is not nervous/anxious.   All other systems reviewed and are negative.   No current outpatient medications on file prior to visit.   No current facility-administered medications on file prior to visit.    Past Medical History:  Diagnosis Date  . Hyperlipidemia   . Hypertension     Past Surgical History:  Procedure Laterality Date  . VASECTOMY      No Known Allergies  Family History  Problem Relation Age of Onset  . Lupus Mother   . Mental illness Maternal Aunt   . Diabetes Maternal Aunt   . Diabetes Maternal Uncle   . Diabetes Maternal Grandmother   . Hypertension Maternal Grandmother   . Hyperlipidemia Maternal Grandmother   . Mental illness Maternal Grandfather     Social History   Socioeconomic History  . Marital status: Married    Spouse name: Not on file  . Number of children: Not on file  . Years of education: Not on file  . Highest education level: Not on file  Occupational History  . Not on file  Social Needs  . Financial resource strain: Not on file  . Food insecurity    Worry: Not on file    Inability: Not on file  . Transportation needs    Medical: Not on file    Non-medical: Not on file  Tobacco Use  . Smoking status: Never Smoker  . Smokeless tobacco: Never Used  Substance and Sexual Activity  . Alcohol use: No  . Drug use: No  . Sexual activity: Yes  Lifestyle  . Physical activity    Days per week: Not on file  Minutes per session: Not on file  . Stress: Not on file  Relationships  . Social Herbalist on phone: Not on file    Gets together: Not on file    Attends religious service: Not on file    Active member of club or organization: Not on file    Attends meetings of clubs or organizations: Not on file    Relationship status: Not on file  Other Topics  Concern  . Not on file  Social History Narrative  . Not on file     Vitals:   09/14/19 0841  BP: 120/68  Pulse: 71  Resp: 16  Temp: (!) 97.3 F (36.3 C)  SpO2: 96%   Body mass index is 36.9 kg/m.   Wt Readings from Last 3 Encounters:  09/14/19 228 lb 9.6 oz (103.7 kg)  08/18/18 238 lb 8 oz (108.2 kg)  12/05/17 235 lb (106.6 kg)    Physical Exam  Nursing note and vitals reviewed. Constitutional: He is oriented to person, place, and time. He appears well-developed. No distress.  HENT:  Head: Normocephalic and atraumatic.  Right Ear: Tympanic membrane, external ear and ear canal normal.  Left Ear: Tympanic membrane, external ear and ear canal normal.  Mouth/Throat: Oropharynx is clear and moist and mucous membranes are normal.  Eyes: Pupils are equal, round, and reactive to light. Conjunctivae and EOM are normal.  Neck: Normal range of motion. No tracheal deviation present. No thyromegaly present.  Cardiovascular: Normal rate and regular rhythm.  No murmur heard. Pulses:      Dorsalis pedis pulses are 2+ on the right side and 2+ on the left side.  Respiratory: Effort normal and breath sounds normal. No respiratory distress.  GI: Soft. He exhibits no mass. There is no hepatomegaly. There is no abdominal tenderness.  Genitourinary:    Genitourinary Comments: Refused,no concerns.   Musculoskeletal:        General: No tenderness or edema.     Comments: No major deformities appreciated and no signs of synovitis.  Lymphadenopathy:    He has no cervical adenopathy.       Right: No supraclavicular adenopathy present.       Left: No supraclavicular adenopathy present.  Neurological: He is alert and oriented to person, place, and time. He has normal strength. No cranial nerve deficit or sensory deficit. Coordination and gait normal.  Reflex Scores:      Bicep reflexes are 2+ on the right side and 2+ on the left side.      Patellar reflexes are 2+ on the right side and 2+ on  the left side. Skin: Skin is warm. No erythema.  Psychiatric: He has a normal mood and affect. Cognition and memory are normal.  Well groomed,good eye contact.    ASSESSMENT AND PLAN:  Mr. Eulon was seen today for annual exam.  Diagnoses and all orders for this visit:  Orders Placed This Encounter  Procedures  . Comprehensive metabolic panel  . Lipid panel  . Hemoglobin A1c  . TSH  . Testosterone   Lab Results  Component Value Date   ALT 22 09/14/2019   AST 18 09/14/2019   ALKPHOS 61 09/14/2019   BILITOT 0.4 09/14/2019    Lab Results  Component Value Date   CHOL 284 (H) 09/14/2019   HDL 46.70 09/14/2019   LDLCALC 223 (H) 09/14/2019   TRIG 72.0 09/14/2019   CHOLHDL 6 09/14/2019   Lab Results  Component Value Date  CREATININE 1.01 09/14/2019   BUN 17 09/14/2019   NA 139 09/14/2019   K 4.1 09/14/2019   CL 103 09/14/2019   CO2 28 09/14/2019   Lab Results  Component Value Date   TESTOSTERONE 317.29 09/14/2019   Lab Results  Component Value Date   TSH 1.24 09/14/2019    Routine general medical examination at a health care facility We discussed the importance of regular physical activity and healthy diet for prevention of chronic illness and/or complications. Preventive guidelines reviewed. Vaccination up to date.  Next CPE in a year.  The 10-year ASCVD risk score Denman George DC Montez Hageman., et al., 2013) is: 3.5%   Values used to calculate the score:     Age: 10 years     Sex: Male     Is Non-Hispanic African American: No     Diabetic: No     Tobacco smoker: No     Systolic Blood Pressure: 120 mmHg     Is BP treated: Yes     HDL Cholesterol: 46.7 mg/dL     Total Cholesterol: 284 mg/dL  Erectile dysfunction, unspecified erectile dysfunction type Treatment options discussed as well as side effects. He would like to try Sildenafil, explained that most insurance do not cover medication. Further recommendations will be given according to labs results.  -      sildenafil (REVATIO) 20 MG tablet; Take 2 tablets (40 mg total) by mouth daily as needed.  Screening for endocrine, metabolic and immunity disorder -     Comprehensive metabolic panel -     Hemoglobin A1c  Essential hypertension, benign Problem is not well controlled. BP re-checked 138/95. After discussion of options, he agrees with adding a new med instead increasing medication he is already taking. Continue Lisinopril-HCTZ 20-12.5 mg daily. Amlodipine 2.5 mg added. Continue monitoring BP. Low salt diet recommended. Eye exam is current. Wt loss will also help.  Hyperlipidemia Continue non pharmacologic treatment. Further recommendations will be given according to FLP results.     Severe obesity (BMI 35.0-39.9) with comorbidity (HCC) We discussed benefits of wt loss as well as adverse effects of obesity. Consistency with healthy diet and physical activity recommended.   Return in 4 months (on 01/12/2020) for HTN,wt.    Cristabel Bicknell G. [place], MD  [facility]. [place] [facility].

## 2019-09-14 NOTE — Assessment & Plan Note (Signed)
Continue non pharmacologic treatment. Further recommendations will be given according to FLP results. 

## 2019-09-14 NOTE — Patient Instructions (Addendum)
A few things to remember from today's visit:   Routine general medical examination at a health care facility  Essential hypertension, benign - Plan: amLODipine (NORVASC) 2.5 MG tablet, lisinopril-hydrochlorothiazide (ZESTORETIC) 20-12.5 MG tablet  Hyperlipidemia, unspecified hyperlipidemia type - Plan: Lipid panel  Erectile dysfunction, unspecified erectile dysfunction type - Plan: sildenafil (REVATIO) 20 MG tablet, TSH, Testosterone  Screening for endocrine, metabolic and immunity disorder - Plan: Comprehensive metabolic panel, Hemoglobin A1c   Please be sure medication list is accurate. If a new problem present, please set up appointment sooner than planned today.       Amlodipine 2.5 mg at bedtime added today.  At least 150 minutes of moderate exercise per week, daily brisk walking for 15-30 min is a good exercise option. Healthy diet low in saturated (animal) fats and sweets and consisting of fresh fruits and vegetables, lean meats such as fish and white chicken and whole grains.  - Vaccines:  Tdap vaccine every 10 years.  Shingles vaccine recommended at age 12, could be given after 43 years of age but not sure about insurance coverage.  Pneumonia vaccines:  Prevnar 72 at 33 and Pneumovax at 18.   -Screening recommendations for low/normal risk males:  Screening for diabetes at age 19 and every 3 years. Earlier screening if cardiovascular risk factors.  Colon cancer screening at age 55 and until age 22.  Prostate cancer screening: some controversy, starts usually at 52: Rectal exam and PSA.  Aortic Abdominal Aneurism once between 99 and 4 years old if ever smoker.  Also recommended:  1. Dental visit- Brush and floss your teeth twice daily; visit your dentist twice a year. 2. Eye doctor- Get an eye exam at least every 2 years. 3. Helmet use- Always wear a helmet when riding a bicycle, motorcycle, rollerblading or skateboarding. 4. Safe sex- If you may be exposed  to sexually transmitted infections, use a condom. 5. Seat belts- Seat belts can save your live; always wear one. 6. Smoke/Carbon Monoxide detectors- These detectors need to be installed on the appropriate level of your home. Replace batteries at least once a year. 7. Skin cancer- When out in the sun please cover up and use sunscreen 15 SPF or higher. 8. Violence- If anyone is threatening or hurting you, please tell your healthcare provider.  9. Drink alcohol in moderation- Limit alcohol intake to one drink or less per day. Never drink and drive.

## 2019-09-14 NOTE — Assessment & Plan Note (Signed)
We discussed benefits of wt loss as well as adverse effects of obesity. Consistency with healthy diet and physical activity recommended.  

## 2019-09-14 NOTE — Assessment & Plan Note (Signed)
Problem is not well controlled. BP re-checked 138/95. After discussion of options, he agrees with adding a new med instead increasing medication he is already taking. Continue Lisinopril-HCTZ 20-12.5 mg daily. Amlodipine 2.5 mg added. Continue monitoring BP. Low salt diet recommended. Eye exam is current. Wt loss will also help.

## 2020-01-11 ENCOUNTER — Ambulatory Visit: Payer: 59 | Admitting: Family Medicine

## 2020-02-22 ENCOUNTER — Ambulatory Visit: Payer: 59 | Admitting: Family Medicine

## 2020-03-17 ENCOUNTER — Ambulatory Visit: Payer: 59 | Admitting: Family Medicine

## 2020-03-20 ENCOUNTER — Other Ambulatory Visit: Payer: Self-pay | Admitting: Family Medicine

## 2020-03-20 DIAGNOSIS — I10 Essential (primary) hypertension: Secondary | ICD-10-CM

## 2020-06-02 ENCOUNTER — Telehealth: Payer: Self-pay | Admitting: Family Medicine

## 2020-06-02 ENCOUNTER — Other Ambulatory Visit: Payer: Self-pay | Admitting: Family Medicine

## 2020-06-02 MED ORDER — TRIAMCINOLONE ACETONIDE 0.1 % EX CREA: 1.0000 "application " | TOPICAL_CREAM | Freq: Two times a day (BID) | CUTANEOUS | 0 refills | Status: AC

## 2020-06-02 NOTE — Telephone Encounter (Signed)
Please advise 

## 2020-06-02 NOTE — Telephone Encounter (Signed)
Triamcinolone cream sent to his pharmacy. Thanks, BJ

## 2020-06-02 NOTE — Telephone Encounter (Signed)
I left patient a voicemail letting him know that the Rx was sent in & to call back to schedule an in office OV if the cream does not help.

## 2020-06-02 NOTE — Telephone Encounter (Signed)
Pt has poison ivy and would like some ointment called in for the itching.  [facility] [place].  Please advise.

## 2020-07-05 ENCOUNTER — Encounter (HOSPITAL_BASED_OUTPATIENT_CLINIC_OR_DEPARTMENT_OTHER): Payer: Self-pay

## 2020-07-05 ENCOUNTER — Emergency Department (HOSPITAL_BASED_OUTPATIENT_CLINIC_OR_DEPARTMENT_OTHER)
Admission: EM | Admit: 2020-07-05 | Discharge: 2020-07-05 | Disposition: A | Discharge status: Home / Self Care | Payer: 59 | Attending: Emergency Medicine | Admitting: Emergency Medicine

## 2020-07-05 ENCOUNTER — Emergency Department (HOSPITAL_BASED_OUTPATIENT_CLINIC_OR_DEPARTMENT_OTHER): Payer: 59

## 2020-07-05 ENCOUNTER — Other Ambulatory Visit: Payer: Self-pay

## 2020-07-05 DIAGNOSIS — M791 Myalgia, unspecified site: Secondary | ICD-10-CM | POA: Diagnosis not present

## 2020-07-05 DIAGNOSIS — U071 COVID-19: Secondary | ICD-10-CM | POA: Diagnosis not present

## 2020-07-05 DIAGNOSIS — Z79899 Other long term (current) drug therapy: Secondary | ICD-10-CM | POA: Diagnosis not present

## 2020-07-05 DIAGNOSIS — I10 Essential (primary) hypertension: Secondary | ICD-10-CM | POA: Insufficient documentation

## 2020-07-05 DIAGNOSIS — R519 Headache, unspecified: Secondary | ICD-10-CM | POA: Diagnosis present

## 2020-07-05 MED ORDER — KETOROLAC TROMETHAMINE 60 MG/2ML IM SOLN
30.0000 mg | Freq: Once | INTRAMUSCULAR | Status: AC
  Administered 2020-07-05: 30 mg via INTRAMUSCULAR
  Filled 2020-07-05: qty 2

## 2020-07-05 NOTE — ED Triage Notes (Signed)
Pt states he thinks he has COVID. His whole family has tested positive and his test is pending. Pt has had fever, chills, HA, body aches, cough, sore throat. Pt states he is here today due to a worsening of the HA's and an elevated B/P. Pt states his B/P at home was 138/96 after doubling his HTN medication.

## 2020-07-05 NOTE — Discharge Instructions (Signed)
Please read and follow all provided instructions.  Your diagnoses today include:  1. COVID-19     Tests performed today include:  Chest x-ray -question of mild pneumonia in the bottoms of both lungs  EKG - similar to 2014, no signs of stress on the heart  Vital signs. See below for your results today.   Medications prescribed:   None  Take any prescribed medications only as directed.  Home care instructions:  Follow any educational materials contained in this packet.  BE VERY CAREFUL not to take multiple medicines containing Tylenol (also called acetaminophen). Doing so can lead to an overdose which can damage your liver and cause liver failure and possibly death.   Follow-up instructions: Please follow-up with your primary care provider as needed for further evaluation of your symptoms.  You may receive a call from the monoclonal antibody clinic to arrange an outpatient infusion.  Return instructions:   Please return to the Emergency Department if you experience worsening symptoms.   Return with worsening trouble breathing, increased shortness of breath or persistent vomiting.  Please return if you have any other emergent concerns.  Additional Information:  Your vital signs today were: BP 104/77 (BP Location: Right Arm)    Pulse 87    Temp 100 F (37.8 C) (Oral)    Resp 16    Ht 5\' 7"  (1.702 m)    Wt 106.6 kg    SpO2 95%    BMI 36.81 kg/m  If your blood pressure (BP) was elevated above 135/85 this visit, please have this repeated by your doctor within one month. --------------

## 2020-07-05 NOTE — ED Provider Notes (Addendum)
[facility] EMERGENCY DEPARTMENT Provider Note   CSN: [id number] Arrival date & time: 07/05/20  1551     History Chief Complaint  Patient presents with  . Headache    COVID presumed    Rodney Beltran is a 44 y.o. male.  Patient presents to the emergency department with complaint of fevers, chills, body aches, cough, sore throat, chest tightness and suspected coronavirus.  He also had a headache as well.  Headache has been moderate to severe.  He reports intermittent lightening type pains in the right side of his head. Patient denies signs of stroke including: facial droop, slurred speech, aphasia, weakness/numbness in extremities, imbalance/trouble walking.  No vision change or vision loss associated with these episodes.  He denies use of anticoagulation or recent head injuries.  He was also concerned because his blood pressure was elevated today, up to 138/96.  He thought that his elevated blood pressure may be contributing to his headache so he took an extra dose of blood pressure medication.  Patient likely has Covid.  His daughter with whom he was in close contact with tested positive earlier this week and his wife who is currently asymptomatic but he was a Runner, broadcasting/film/video, also tested positive.  Patient has had 1 dose of the vaccine for coronavirus, was due to get his second dose 2 days ago but he got sick.        Past Medical History:  Diagnosis Date  . Hyperlipidemia   . Hypertension     Patient Active Problem List   Diagnosis Date Noted  . Subluxation of peroneal tendon of left foot 08/04/2016  . Severe obesity (BMI 35.0-39.9) with comorbidity (HCC) 03/16/2016  . Essential hypertension, benign 03/15/2016  . Hyperlipidemia 03/15/2016    Past Surgical History:  Procedure Laterality Date  . VASECTOMY         Family History  Problem Relation Age of Onset  . Lupus Mother   . Mental illness Maternal Aunt   . Diabetes Maternal Aunt   . Diabetes Maternal  Uncle   . Diabetes Maternal Grandmother   . Hypertension Maternal Grandmother   . Hyperlipidemia Maternal Grandmother   . Mental illness Maternal Grandfather     Social History   Tobacco Use  . Smoking status: Never Smoker  . Smokeless tobacco: Never Used  Vaping Use  . Vaping Use: Never used  Substance Use Topics  . Alcohol use: No  . Drug use: No    Home Medications Prior to Admission medications   Medication Sig Start Date End Date Taking? Authorizing Provider  amLODipine (NORVASC) 2.5 MG tablet TAKE 1 TABLET BY MOUTH EVERY DAY 03/24/20   [place], Betty G, MD  lisinopril-hydrochlorothiazide (ZESTORETIC) 20-12.5 MG tablet TAKE ONE TABLET BY MOUTH ONE TIME DAILY **MUST CALL DR. FOR APPOINTMENT** 03/24/20   [place], Betty G, MD  sildenafil (REVATIO) 20 MG tablet Take 2 tablets (40 mg total) by mouth daily as needed. 09/14/19   [place], Betty G, MD    Allergies    Patient has no known allergies.  Review of Systems   Review of Systems  Constitutional: Positive for chills, fatigue and fever.  HENT: Positive for congestion and sore throat. Negative for dental problem, rhinorrhea and sinus pressure.   Eyes: Negative for photophobia, discharge, redness and visual disturbance.  Respiratory: Positive for cough and chest tightness. Negative for shortness of breath.   Cardiovascular: Negative for chest pain.  Gastrointestinal: Negative for abdominal pain, diarrhea, nausea and vomiting.  Genitourinary:  Negative for dysuria and hematuria.  Musculoskeletal: Positive for myalgias. Negative for gait problem, neck pain and neck stiffness.  Skin: Negative for rash.  Neurological: Positive for headaches. Negative for syncope, speech difficulty, weakness, light-headedness and numbness.  Psychiatric/Behavioral: Negative for confusion.    Physical Exam Updated Vital Signs BP 122/90 (BP Location: Right Arm)   Pulse 90   Temp 98.7 F (37.1 C) (Oral)   Resp 20   Ht 5\' 7"  (1.702 m)   Wt 106.6  kg   SpO2 92%   BMI 36.81 kg/m   Physical Exam Vitals and nursing note reviewed.  Constitutional:      Appearance: He is well-developed.  HENT:     Head: Normocephalic and atraumatic.     Right Ear: Tympanic membrane, ear canal and external ear normal.     Left Ear: Tympanic membrane, ear canal and external ear normal.     Nose: Nose normal.     Mouth/Throat:     Mouth: Mucous membranes are moist.     Pharynx: Uvula midline.  Eyes:     General: Lids are normal.        Right eye: No discharge.        Left eye: No discharge.     Conjunctiva/sclera: Conjunctivae normal.     Pupils: Pupils are equal, round, and reactive to light.  Cardiovascular:     Rate and Rhythm: Normal rate and regular rhythm.     Heart sounds: Normal heart sounds.  Pulmonary:     Effort: Pulmonary effort is normal.     Breath sounds: Normal breath sounds.  Abdominal:     Palpations: Abdomen is soft.     Tenderness: There is no abdominal tenderness.  Musculoskeletal:        General: Normal range of motion.     Cervical back: Normal range of motion and neck supple. No tenderness or bony tenderness.  Skin:    General: Skin is warm and dry.  Neurological:     Mental Status: He is alert and oriented to person, place, and time.     GCS: GCS eye subscore is 4. GCS verbal subscore is 5. GCS motor subscore is 6.     Cranial Nerves: No cranial nerve deficit.     Sensory: No sensory deficit.     Motor: No abnormal muscle tone.     Coordination: Coordination normal.     Gait: Gait normal.     Comments: Patient has fleeting 'shooting pains' in right temporal area during my exam, looks somewhat uncomfortable briefly but then pain resolves.   Patient stands from a lying position, walks around the bed, without difficulty.      ED Results / Procedures / Treatments   Labs (all labs ordered are listed, but only abnormal results are displayed) Labs Reviewed - No data to display  EKG EKG  Interpretation  Date/Time:  Saturday July 05 2020 18:30:55 EDT Ventricular Rate:  77 PR Interval:    QRS Duration: 85 QT Interval:  389 QTC Calculation: 441 R Axis:   39 Text Interpretation: Sinus rhythm Borderline T abnormalities, diffuse leads Confirmed by Kennis CarinaBero, Michael [phone]([id number]) on 07/05/2020 7:14:24 PM   Radiology DG Chest Port 1 View  Result Date: 07/05/2020 CLINICAL DATA:  COVID. Chest tightness. Cough sore throat and body aches. Family is COVID positive. EXAM: PORTABLE CHEST 1 VIEW COMPARISON:  None. FINDINGS: Upper normal heart size.The cardiomediastinal contours are normal. Minor subsegmental opacity at the lung bases. Pulmonary vasculature is  normal. No confluent consolidation, pleural effusion, or pneumothorax. No acute osseous abnormalities are seen. IMPRESSION: Minor subsegmental opacity at the lung bases, likely atelectasis, however pneumonia is considered in the setting of COVID 19 infection. Electronically Signed   By: Narda Rutherford M.D.   On: 07/05/2020 18:35    Procedures Procedures (including critical care time)  Medications Ordered in ED Medications  ketorolac (TORADOL) injection 30 mg (30 mg Intramuscular Given 07/05/20 1834)    ED Course  I have reviewed the triage vital signs and the nursing notes.  Pertinent labs & imaging results that were available during my care of the patient were reviewed by me and considered in my medical decision making (see chart for details).  Patient seen and examined. Patient looks well. He voices concerns mainly about the headache and the chest tightness.  He has a completely intact neuro exam.  He drove himself to the emergency department.  We will give IM Toradol for pain.  I do not feel he requires CT imaging of the brain at this point.  I have low concern for thrombotic event given presentation.  In regards to this chest tightness, feel be reasonable to obtain an EKG and a chest x-ray.  Patient with oxygen saturation of 92% on  room air on arrival.  Will obtain ambulatory pulse ox.  Vital signs reviewed and are as follows: BP 122/90 (BP Location: Right Arm)   Pulse 90   Temp 98.7 F (37.1 C) (Oral)   Resp 20   Ht 5\' 7"  (1.702 m)   Wt 106.6 kg   SpO2 92%   BMI 36.81 kg/m   7:29 PM EKG reviewed by myself.  Discussed with Dr. .  Similar to 2014 without acute changes.  Ambulatory pulse ox 95% on room air.  patient rechecked.  He is resting comfortably in the room.  States that headache is improved with Toradol.  We discussed chest x-ray and EKG results.  We discussed isolation and quarantine recommendations.  Discussed signs and symptoms to return including worsening shortness of breath, increased work of breathing, persistent vomiting or other concerns.  Patient is interested in monoclonal antibodies.  Will send this information to the clinic.  We also discussed return with severe headache, if they develop weakness in their arms or legs, slurred speech, trouble walking or talking, confusion, trouble with their balance, or if they have any other concerns. Patient verbalizes understanding and agrees with plan.   Rodney Beltran was evaluated in Emergency Department on 07/05/2020 for the symptoms described in the history of present illness. He was evaluated in the context of the global COVID-19 pandemic, which necessitated consideration that the patient might be at risk for infection with the SARS-CoV-2 virus that causes COVID-19. Institutional protocols and algorithms that pertain to the evaluation of patients at risk for COVID-19 are in a state of rapid change based on information released by regulatory bodies including the CDC and federal and state organizations. These policies and algorithms were followed during the patient's care in the ED.      MDM Rules/Calculators/A&P                          Patient with COVID-19 infection suspected.  Results do back tomorrow but to close exposures at home.   Possible mild pneumonia on chest x-ray.  He has some chest tightness without significant shortness of breath.  Nursing given pneumonia on chest x-ray.  He is not hypoxic  with ambulation.  He looks well.  In regards to his headache, likely related to coronavirus infection.  His blood pressures look okay here.  He has a completely normal neurologic exam.  I do not suspect venous sinus thrombosis given his current presentation.  Symptoms are controlled with Toradol.  I do not feel that he requires CT imaging or MRI at this point.   Final Clinical Impression(s) / ED Diagnoses Final diagnoses:  COVID-19    Rx / DC Orders ED Discharge Orders    None         Renne Crigler, Cordelia Poche 07/05/20 1934    Sabas Sous, MD 07/05/20 2258

## 2020-07-05 NOTE — ED Notes (Signed)
Pt ambulated length of hallway x 2 and maintained O2 sats of 95%

## 2020-07-05 NOTE — ED Notes (Signed)
Pt discharged to home. Discharge instructions have been discussed with patient and/or family members. Pt verbally acknowledges understanding d/c instructions, and endorses comprehension to checkout at registration before leaving.  °

## 2020-07-06 ENCOUNTER — Other Ambulatory Visit: Payer: Self-pay | Admitting: Unknown Physician Specialty

## 2020-07-06 ENCOUNTER — Telehealth: Payer: Self-pay | Admitting: Unknown Physician Specialty

## 2020-07-06 DIAGNOSIS — U071 COVID-19: Secondary | ICD-10-CM

## 2020-07-06 DIAGNOSIS — I10 Essential (primary) hypertension: Secondary | ICD-10-CM

## 2020-07-06 NOTE — Telephone Encounter (Signed)
.  I connected by phone with Rodney Beltran on 07/06/2020 at 9:35 AM to discuss the potential use of a new treatment for mild to moderate COVID-19 viral infection in non-hospitalized patients.  This patient is a 44 y.o. male that meets the FDA criteria for Emergency Use Authorization of COVID monoclonal antibody casirivimab/imdevimab.  Has a (+) direct SARS-CoV-2 viral test result  Has mild or moderate COVID-19   Is NOT hospitalized due to COVID-19  Is within 10 days of symptom onset  Has at least one of the high risk factor(s) for progression to severe COVID-19 and/or hospitalization as defined in EUA.  Specific high risk criteria : BMI > 25   I have spoken and communicated the following to the patient or parent/caregiver regarding COVID monoclonal antibody treatment:  1. FDA has authorized the emergency use for the treatment of mild to moderate COVID-19 in adults and pediatric patients with positive results of direct SARS-CoV-2 viral testing who are 45 years of age and older weighing at least 40 kg, and who are at high risk for progressing to severe COVID-19 and/or hospitalization.  2. The significant known and potential risks and benefits of COVID monoclonal antibody, and the extent to which such potential risks and benefits are unknown.  3. Information on available alternative treatments and the risks and benefits of those alternatives, including clinical trials.  4. Patients treated with COVID monoclonal antibody should continue to self-isolate and use infection control measures (e.g., wear mask, isolate, social distance, avoid sharing personal items, clean and disinfect "high touch" surfaces, and frequent handwashing) according to CDC guidelines.   5. The patient or parent/caregiver has the option to accept or refuse COVID monoclonal antibody treatment.  After reviewing this information with the patient, The patient agreed to proceed with receiving casirivimab\imdevimab  infusion and will be provided a copy of the Fact sheet prior to receiving the infusion. [name] 07/06/2020 9:35 AM

## 2020-07-07 ENCOUNTER — Ambulatory Visit (HOSPITAL_COMMUNITY)
Admission: RE | Admit: 2020-07-07 | Discharge: 2020-07-07 | Disposition: A | Discharge status: Home / Self Care | Payer: 59 | Source: Ambulatory Visit | Attending: Pulmonary Disease | Admitting: Pulmonary Disease

## 2020-07-07 DIAGNOSIS — Z6835 Body mass index (BMI) 35.0-35.9, adult: Secondary | ICD-10-CM | POA: Diagnosis not present

## 2020-07-07 DIAGNOSIS — I10 Essential (primary) hypertension: Secondary | ICD-10-CM | POA: Diagnosis not present

## 2020-07-07 DIAGNOSIS — U071 COVID-19: Secondary | ICD-10-CM | POA: Diagnosis present

## 2020-07-07 MED ORDER — DIPHENHYDRAMINE HCL 50 MG/ML IJ SOLN: 50.0000 mg | Freq: Once | INTRAMUSCULAR | Status: DC | PRN

## 2020-07-07 MED ORDER — SODIUM CHLORIDE 0.9 % IV SOLN: INTRAVENOUS | Status: DC | PRN

## 2020-07-07 MED ORDER — FAMOTIDINE IN NACL 20-0.9 MG/50ML-% IV SOLN: 20.0000 mg | Freq: Once | INTRAVENOUS | Status: DC | PRN

## 2020-07-07 MED ORDER — ALBUTEROL SULFATE HFA 108 (90 BASE) MCG/ACT IN AERS: 2.0000 | INHALATION_SPRAY | Freq: Once | RESPIRATORY_TRACT | Status: DC | PRN

## 2020-07-07 MED ORDER — SODIUM CHLORIDE 0.9 % IV SOLN
1200.0000 mg | Freq: Once | INTRAVENOUS | Status: AC
  Administered 2020-07-07: 1200 mg via INTRAVENOUS

## 2020-07-07 MED ORDER — EPINEPHRINE 0.3 MG/0.3ML IJ SOAJ: 0.3000 mg | Freq: Once | INTRAMUSCULAR | Status: DC | PRN

## 2020-07-07 MED ORDER — METHYLPREDNISOLONE SODIUM SUCC 125 MG IJ SOLR: 125.0000 mg | Freq: Once | INTRAMUSCULAR | Status: DC | PRN

## 2020-07-07 NOTE — Progress Notes (Signed)
  Diagnosis: COVID-19  Physician: Dr.Patrick Delford Field  Procedure: Covid Infusion Clinic Med: casirivimab\imdevimab infusion - Provided patient with casirivimab\imdevimab fact sheet for patients, parents and caregivers prior to infusion.  Complications: No immediate complications noted.  Discharge: Discharged home   Arthor Captain 07/07/2020

## 2020-07-07 NOTE — Discharge Instructions (Signed)

## 2020-09-27 IMAGING — DX DG CHEST 1V PORT
1 series · 1 of 1 positions shown · non-contrast
Comparison: None.

CLINICAL DATA: COVID. Chest tightness. Cough sore throat and body
aches. Family is COVID positive.

EXAM:
PORTABLE CHEST 1 VIEW

[chest ap]
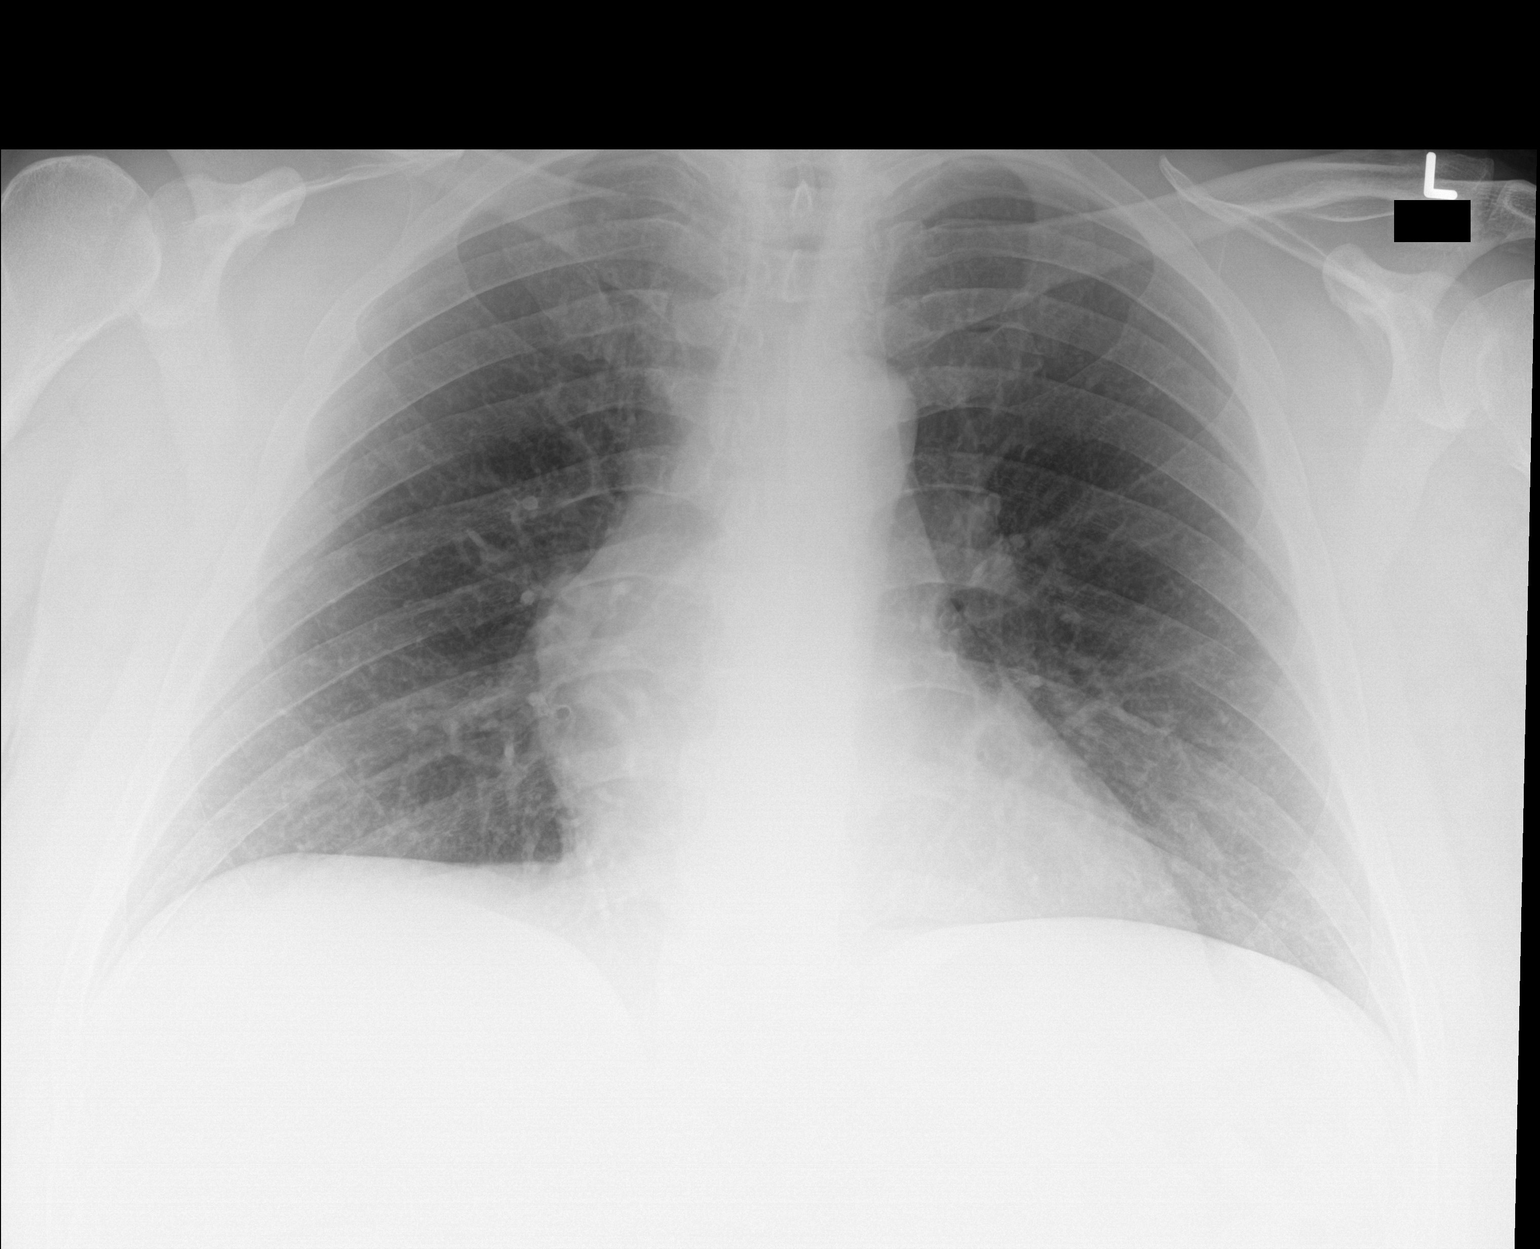

[1 of 1 positions shown; findings below may reference images not displayed]

FINDINGS: Upper normal heart size.The cardiomediastinal contours are normal.
Minor subsegmental opacity at the lung bases. Pulmonary vasculature
is normal. No confluent consolidation, pleural effusion, or
pneumothorax. No acute osseous abnormalities are seen.
IMPRESSION: Minor subsegmental opacity at the lung bases, likely atelectasis,
however pneumonia is considered in the setting of COVID 19
infection.

## 2020-10-01 ENCOUNTER — Telehealth: Payer: Self-pay | Admitting: Family Medicine

## 2020-10-01 DIAGNOSIS — I10 Essential (primary) hypertension: Secondary | ICD-10-CM

## 2020-10-01 MED ORDER — LISINOPRIL-HYDROCHLOROTHIAZIDE 20-12.5 MG PO TABS: 1.0000 | ORAL_TABLET | Freq: Every day | ORAL | 0 refills | Status: DC

## 2020-10-01 MED ORDER — AMLODIPINE BESYLATE 2.5 MG PO TABS: 2.5000 mg | ORAL_TABLET | Freq: Every day | ORAL | 0 refills | Status: DC

## 2020-10-01 NOTE — Telephone Encounter (Signed)
Pt call and stated he need a refill on lisinopril-hydrochlorothiazide (ZESTORETIC) 20-12.5 MG tablet and [facility] [address] ,[place] [address]

## 2020-10-01 NOTE — Telephone Encounter (Signed)
Rx sent into the pharmacy as requested.

## 2020-10-16 ENCOUNTER — Other Ambulatory Visit: Payer: Self-pay | Admitting: Family Medicine

## 2020-10-16 DIAGNOSIS — I10 Essential (primary) hypertension: Secondary | ICD-10-CM

## 2020-10-16 NOTE — Telephone Encounter (Signed)
Last OV 09/14/19 Last refill 10/01/20 #30/0 Next OV not scheduled  > 1 year since last visit, forwarding to PCP

## 2020-10-23 ENCOUNTER — Other Ambulatory Visit: Payer: Self-pay | Admitting: Family Medicine

## 2020-10-23 DIAGNOSIS — I10 Essential (primary) hypertension: Secondary | ICD-10-CM

## 2020-12-05 ENCOUNTER — Telehealth: Payer: Self-pay

## 2020-12-05 ENCOUNTER — Other Ambulatory Visit: Payer: Self-pay | Admitting: Family Medicine

## 2020-12-05 DIAGNOSIS — N529 Male erectile dysfunction, unspecified: Secondary | ICD-10-CM

## 2020-12-05 NOTE — Telephone Encounter (Signed)
Patient called in with a few questions in regards to getting his 2nd covid vaccine. Pt had covid & the infusion back in 06/2020. Pt is past the 90 day window to receive his 2nd vaccine. Pt will schedule his 2nd vaccine. Pt asked about if he had paperwork for [place] to fill out from his job if we could do that for him. Advised we may need a visit, but we can address that if the situation arises. Pt verbalized understanding.

## 2020-12-29 ENCOUNTER — Other Ambulatory Visit: Payer: Self-pay | Admitting: Family Medicine

## 2020-12-29 DIAGNOSIS — I10 Essential (primary) hypertension: Secondary | ICD-10-CM

## 2021-03-04 ENCOUNTER — Other Ambulatory Visit: Payer: Self-pay | Admitting: Family Medicine

## 2021-03-04 DIAGNOSIS — I10 Essential (primary) hypertension: Secondary | ICD-10-CM

## 2021-03-23 ENCOUNTER — Other Ambulatory Visit: Payer: Self-pay | Admitting: Family Medicine

## 2021-03-23 DIAGNOSIS — I10 Essential (primary) hypertension: Secondary | ICD-10-CM

## 2021-03-23 MED ORDER — LISINOPRIL-HYDROCHLOROTHIAZIDE 20-12.5 MG PO TABS: 1.0000 | ORAL_TABLET | Freq: Every day | ORAL | 0 refills | Status: DC

## 2021-04-03 ENCOUNTER — Other Ambulatory Visit: Payer: Self-pay

## 2021-04-03 ENCOUNTER — Ambulatory Visit (INDEPENDENT_AMBULATORY_CARE_PROVIDER_SITE_OTHER): Payer: No Typology Code available for payment source | Admitting: Family Medicine

## 2021-04-03 ENCOUNTER — Encounter: Payer: Self-pay | Admitting: Family Medicine

## 2021-04-03 VITALS — BP 128/80 | HR 80 | Resp 16 | Ht 67.0 in | Wt 252.5 lb

## 2021-04-03 DIAGNOSIS — E785 Hyperlipidemia, unspecified: Secondary | ICD-10-CM | POA: Diagnosis not present

## 2021-04-03 DIAGNOSIS — I10 Essential (primary) hypertension: Secondary | ICD-10-CM

## 2021-04-03 DIAGNOSIS — Z130 Encounter for screening for diseases of the blood and blood-forming organs and certain disorders involving the immune mechanism: Secondary | ICD-10-CM | POA: Diagnosis not present

## 2021-04-03 DIAGNOSIS — Z1159 Encounter for screening for other viral diseases: Secondary | ICD-10-CM

## 2021-04-03 DIAGNOSIS — Z1329 Encounter for screening for other suspected endocrine disorder: Secondary | ICD-10-CM

## 2021-04-03 DIAGNOSIS — Z13228 Encounter for screening for other metabolic disorders: Secondary | ICD-10-CM | POA: Diagnosis not present

## 2021-04-03 DIAGNOSIS — Z0000 Encounter for general adult medical examination without abnormal findings: Secondary | ICD-10-CM | POA: Diagnosis not present

## 2021-04-03 DIAGNOSIS — N529 Male erectile dysfunction, unspecified: Secondary | ICD-10-CM

## 2021-04-03 DIAGNOSIS — Z1211 Encounter for screening for malignant neoplasm of colon: Secondary | ICD-10-CM

## 2021-04-03 LAB — COMPREHENSIVE METABOLIC PANEL
ALT: 44 U/L (ref 0–53)
AST: 31 U/L (ref 0–37)
Albumin: 4.6 g/dL (ref 3.5–5.2)
Alkaline Phosphatase: 65 U/L (ref 39–117)
BUN: 19 mg/dL (ref 6–23)
CO2: 28 mEq/L (ref 19–32)
Calcium: 10 mg/dL (ref 8.4–10.5)
Chloride: 102 mEq/L (ref 96–112)
Creatinine, Ser: 1.08 mg/dL (ref 0.40–1.50)
GFR: 83.2 mL/min (ref >60.00)
Glucose, Bld: 101 mg/dL — ABNORMAL HIGH (ref 70–99)
Potassium: 4 mEq/L (ref 3.5–5.1)
Sodium: 139 mEq/L (ref 135–145)
Total Bilirubin: 0.8 mg/dL (ref 0.2–1.2)
Total Protein: 7 g/dL (ref 6.0–8.3)

## 2021-04-03 LAB — LIPID PANEL
Cholesterol: 243 mg/dL — ABNORMAL HIGH (ref 0–200)
HDL: 42.4 mg/dL (ref >39.00)
LDL Cholesterol: 161 mg/dL — ABNORMAL HIGH (ref 0–99)
NonHDL: 200.9
Total CHOL/HDL Ratio: 6
Triglycerides: 198 mg/dL — ABNORMAL HIGH (ref 0.0–149.0)
VLDL: 39.6 mg/dL (ref 0.0–40.0)

## 2021-04-03 LAB — HEMOGLOBIN A1C: Hgb A1c MFr Bld: 6.2 % (ref 4.6–6.5)

## 2021-04-03 MED ORDER — SILDENAFIL CITRATE 20 MG PO TABS: ORAL_TABLET | ORAL | 3 refills | Status: DC

## 2021-04-03 NOTE — Patient Instructions (Addendum)
A few things to remember from today's visit:  Routine general medical examination at a health care facility  Essential hypertension, benign  Hyperlipidemia, unspecified hyperlipidemia type - Plan: Lipid panel  Erectile dysfunction, unspecified erectile dysfunction type - Plan: sildenafil (REVATIO) 20 MG tablet  Encounter for HCV screening test for low risk patient - Plan: Hepatitis C antibody screen  Colon cancer screening  Screening for endocrine, metabolic and immunity disorder - Plan: Hemoglobin A1c, Comprehensive metabolic panel  If you need refills please call your pharmacy. Do not use My Chart to request refills or for acute issues that need immediate attention.   As far as blood pressure is stable and labs are not very abnormal, annual follow up is appropriate. Monitor blood pressure regularly.  Please be sure medication list is accurate. If a new problem present, please set up appointment sooner than planned today.   At least 150 minutes of moderate exercise per week, daily brisk walking for 15-30 min is a good exercise option. Healthy diet low in saturated (animal) fats and sweets and consisting of fresh fruits and vegetables, lean meats such as fish and white chicken and whole grains.  - Vaccines:  Tdap vaccine every 10 years.  Shingles vaccine recommended at age 45, could be given after 45 years of age but not sure about insurance coverage.  Pneumonia vaccines: Pneumovax at 665   -Screening recommendations for low/normal risk males:  Screening for diabetes at age 46 and every 3 years. Earlier screening if cardiovascular risk factors.   Lipid screening at 35 and every 3 years. Screening starts in younger males with cardiovascular risk factors. N/A  Colon cancer screening is now at age 84 but your insurance may not cover until age 15 .screening is recommended age 76. FIT cards  Prostate cancer screening: some controversy, starts usually at 50: Rectal exam and  PSA.  Aortic Abdominal Aneurism once between 7 and 58 years old if ever smoker.  Also recommended:  1. Dental visit- Brush and floss your teeth twice daily; visit your dentist twice a year. 2. Eye doctor- Get an eye exam at least every 2 years. 3. Helmet use- Always wear a helmet when riding a bicycle, motorcycle, rollerblading or skateboarding. 4. Safe sex- If you may be exposed to sexually transmitted infections, use a condom. 5. Seat belts- Seat belts can save your live; always wear one. 6. Smoke/Carbon Monoxide detectors- These detectors need to be installed on the appropriate level of your home. Replace batteries at least once a year. 7. Skin cancer- When out in the sun please cover up and use sunscreen 15 SPF or higher. 8. Violence- If anyone is threatening or hurting you, please tell your healthcare provider.  9. Drink alcohol in moderation- Limit alcohol intake to one drink or less per day. Never drink and drive.

## 2021-04-03 NOTE — Progress Notes (Signed)
HPI:  Mr. Lennart PallJosue Elias Flight is a 45 y.o.male here today for his routine physical examination.  Last CPE: 09/14/19. He lives with wife and 2 children.  Regular exercise 3 or more times per week: He has not been consistent, has not exercise for 2 weeks. He was doing circuit training but still walking his dog 3 miles daily. Following a healthy diet: He has not been consistent.  Chronic medical problems: HLD,HTN,IFG,ED, and obesity among some.  Immunization History  Administered Date(s) Administered  . Moderna Sars-Covid-2 Vaccination 06/05/2020, 12/05/2020  . Tdap 11/08/2014  . Yellow Fever 11/08/2014   -Hep C screening: Never.  Last colon cancer screening: N/A Last prostate ca screening: N/A  Negative for high alcohol intake or tobacco use.  Follow-up and concerns and/or follow up today:  Last seen on 09/14/19. No new problems since his last visit.  HLD: He is not on pharmacologic treatment.  Lab Results  Component Value Date   CHOL 284 (H) 09/14/2019   HDL 46.70 09/14/2019   LDLCALC 223 (H) 09/14/2019   TRIG 72.0 09/14/2019   CHOLHDL 6 09/14/2019   HTN: He is on Lisinopril-HCTZ 20-12.5 mg daily. He is not longer on Amlodipine. He is not checking BP at home. Negative for severe/frequent headache, chest pain, dyspnea, or focal weakness.  Lab Results  Component Value Date   CREATININE 1.01 09/14/2019   BUN 17 09/14/2019   NA 139 09/14/2019   K 4.1 09/14/2019   CL 103 09/14/2019   CO2 28 09/14/2019   ED: He is on Sildenafil 20 mg, takes 3 tabs daily prn. Medication is still helping, no side effects reported.  2 months ago got splint in left foot while walking on his deck. Callus has developed around area, he has tried to remove it, unsuccessfully. Having mild pain, no edema or erythema.  Review of Systems  Constitutional: Negative for activity change, appetite change, fatigue and fever.  HENT: Negative for nosebleeds, sore throat, trouble swallowing and  voice change.   Eyes: Negative for redness and visual disturbance.  Respiratory: Negative for cough and wheezing.   Cardiovascular: Negative for palpitations and leg swelling.  Gastrointestinal: Negative for abdominal pain, blood in stool, nausea and vomiting.  Endocrine: Negative for cold intolerance, heat intolerance, polydipsia, polyphagia and polyuria.  Genitourinary: Negative for decreased urine volume, dysuria, genital sores, hematuria and testicular pain.  Musculoskeletal: Negative for gait problem and myalgias.  Skin: Negative for color change and rash.  Allergic/Immunologic: Negative for environmental allergies.  Neurological: Negative for syncope, facial asymmetry and weakness.  Hematological: Negative for adenopathy. Does not bruise/bleed easily.  Psychiatric/Behavioral: Negative for confusion. The patient is not nervous/anxious.   All other systems reviewed and are negative.  Current Outpatient Medications on File Prior to Visit  Medication Sig Dispense Refill  . lisinopril-hydrochlorothiazide (ZESTORETIC) 20-12.5 MG tablet Take 1 tablet by mouth daily. 30 tablet 0   No current facility-administered medications on file prior to visit.   Past Medical History:  Diagnosis Date  . Hyperlipidemia   . Hypertension    Past Surgical History:  Procedure Laterality Date  . VASECTOMY     No Known Allergies  Family History  Problem Relation Age of Onset  . Lupus Mother   . Mental illness Maternal Aunt   . Diabetes Maternal Aunt   . Diabetes Maternal Uncle   . Diabetes Maternal Grandmother   . Hypertension Maternal Grandmother   . Hyperlipidemia Maternal Grandmother   . Mental illness Maternal Grandfather  Social History   Socioeconomic History  . Marital status: Married    Spouse name: Not on file  . Number of children: Not on file  . Years of education: Not on file  . Highest education level: Not on file  Occupational History  . Not on file  Tobacco Use  .  Smoking status: Never Smoker  . Smokeless tobacco: Never Used  Vaping Use  . Vaping Use: Never used  Substance and Sexual Activity  . Alcohol use: No  . Drug use: No  . Sexual activity: Yes  Other Topics Concern  . Not on file  Social History Narrative  . Not on file   Social Determinants of Health   Financial Resource Strain: Not on file  Food Insecurity: Not on file  Transportation Needs: Not on file  Physical Activity: Not on file  Stress: Not on file  Social Connections: Not on file   Vitals:   04/03/21 1008  BP: 128/80  Pulse: 80  Resp: 16  SpO2: 97%   Body mass index is 39.55 kg/m.  Wt Readings from Last 3 Encounters:  04/03/21 252 lb 8 oz (114.5 kg)  07/05/20 235 lb (106.6 kg)  09/14/19 228 lb 9.6 oz (103.7 kg)   Physical Exam Vitals and nursing note reviewed.  Constitutional:      General: He is not in acute distress.    Appearance: He is well-developed and well-groomed.  HENT:     Head: Normocephalic and atraumatic.     Right Ear: Tympanic membrane, ear canal and external ear normal.     Left Ear: Tympanic membrane, ear canal and external ear normal.     Mouth/Throat:     Mouth: Mucous membranes are moist.     Pharynx: Oropharynx is clear.  Eyes:     Extraocular Movements: Extraocular movements intact.     Conjunctiva/sclera: Conjunctivae normal.     Pupils: Pupils are equal, round, and reactive to light.  Neck:     Thyroid: No thyromegaly.     Trachea: No tracheal deviation.  Cardiovascular:     Rate and Rhythm: Normal rate and regular rhythm.     Pulses:          Dorsalis pedis pulses are 2+ on the right side and 2+ on the left side.     Heart sounds: No murmur heard.   Pulmonary:     Effort: Pulmonary effort is normal. No respiratory distress.     Breath sounds: Normal breath sounds.  Chest:  Breasts:     Right: No supraclavicular adenopathy.     Left: No supraclavicular adenopathy.    Abdominal:     Palpations: Abdomen is soft.  There is no hepatomegaly or mass.     Tenderness: There is no abdominal tenderness.  Genitourinary:    Comments: No concerns. Musculoskeletal:        General: No tenderness.     Cervical back: Normal range of motion.       Feet:     Comments: No major deformities appreciated and no signs of synovitis.  Lymphadenopathy:     Cervical: No cervical adenopathy.     Upper Body:     Right upper body: No supraclavicular adenopathy.     Left upper body: No supraclavicular adenopathy.  Skin:    General: Skin is warm.     Findings: No erythema.  Neurological:     General: No focal deficit present.     Mental Status: He is alert  and oriented to person, place, and time.     Cranial Nerves: No cranial nerve deficit.     Sensory: No sensory deficit.     Coordination: Coordination normal.     Gait: Gait normal.     Deep Tendon Reflexes:     Reflex Scores:      Bicep reflexes are 2+ on the right side and 2+ on the left side.      Patellar reflexes are 2+ on the right side and 2+ on the left side. Psychiatric:        Mood and Affect: Mood is not anxious or depressed.   ASSESSMENT AND PLAN:  Kollin was seen today for annual exam.  Diagnoses and all orders for this visit: Lab Results  Component Value Date   CREATININE 1.08 04/03/2021   BUN 19 04/03/2021   NA 139 04/03/2021   K 4.0 04/03/2021   CL 102 04/03/2021   CO2 28 04/03/2021   Lab Results  Component Value Date   ALT 44 04/03/2021   AST 31 04/03/2021   ALKPHOS 65 04/03/2021   BILITOT 0.8 04/03/2021   Lab Results  Component Value Date   CHOL 243 (H) 04/03/2021   HDL 42.40 04/03/2021   LDLCALC 161 (H) 04/03/2021   TRIG 198.0 (H) 04/03/2021   CHOLHDL 6 04/03/2021   Lab Results  Component Value Date   HGBA1C 6.2 04/03/2021   Routine general medical examination at a health care facility We discussed the importance of regular physical activity and healthy diet for prevention of chronic illness and/or  complications. Preventive guidelines reviewed. Vaccination up to date.  Next CPE in a year.  The 10-year ASCVD risk score Denman George DC Montez Hageman., et al., 2013) is: 3.7%   Values used to calculate the score:     Age: 25 years     Sex: Male     Is Non-Hispanic African American: No     Diabetic: No     Tobacco smoker: No     Systolic Blood Pressure: 128 mmHg     Is BP treated: Yes     HDL Cholesterol: 42.4 mg/dL     Total Cholesterol: 243 mg/dL  Essential hypertension, benign BP adequately controlled. Recommend monitoring BP regularly. Eye exam is current. Low salt diet. Because work schedule it is easier for him to follow annually.  Hyperlipidemia, unspecified hyperlipidemia type Low fat diet/non pharmacologic treatment. Further recommendations will be given according to 10 years CV risk score and lipid panel numbers.  Erectile dysfunction, unspecified erectile dysfunction type Problem is well controlled. Continue Sildenafil 40-60 mg daily prn.  -     sildenafil (REVATIO) 20 MG tablet; TAKE 2-3 TABLETS BY MOUTH ONCE DAILY AS NEEDED  Encounter for HCV screening test for low risk patient -     Hepatitis C antibody screen  Colon cancer screening FIT cards will be provide today.  Screening for endocrine, metabolic and immunity disorder -     Comprehensive metabolic panel -     Hemoglobin A1c  Severe obesity (BMI 35.0-39.9) with comorbidity (HCC) We discussed benefits of wt loss as well as adverse effects of obesity. Encouraged consistency with healthy diet and physical activity recommended.   Return in 1 year (on 04/03/2022).   Paidyn Mcferran G. [place], MD  [facility]. [place] [facility].  A few things to remember from today's visit:  Routine general medical examination at a health care facility  Essential hypertension, benign  Hyperlipidemia, unspecified hyperlipidemia type - Plan: Lipid panel  Erectile dysfunction, unspecified erectile dysfunction type - Plan:  sildenafil (REVATIO) 20 MG tablet  Encounter for HCV screening test for low risk patient - Plan: Hepatitis C antibody screen  Colon cancer screening  Screening for endocrine, metabolic and immunity disorder - Plan: Hemoglobin A1c, Comprehensive metabolic panel  If you need refills please call your pharmacy. Do not use My Chart to request refills or for acute issues that need immediate attention.   As far as blood pressure is stable and labs are not very abnormal, annual follow up is appropriate. Monitor blood pressure regularly.  Please be sure medication list is accurate. If a new problem present, please set up appointment sooner than planned today.   At least 150 minutes of moderate exercise per week, daily brisk walking for 15-30 min is a good exercise option. Healthy diet low in saturated (animal) fats and sweets and consisting of fresh fruits and vegetables, lean meats such as fish and white chicken and whole grains.  - Vaccines:  Tdap vaccine every 10 years.  Shingles vaccine recommended at age 50, could be given after 45 years of age but not sure about insurance coverage.  Pneumonia vaccines: Pneumovax at 665   -Screening recommendations for low/normal risk males:  Screening for diabetes at age 88 and every 3 years. Earlier screening if cardiovascular risk factors.   Lipid screening at 35 and every 3 years. Screening starts in younger males with cardiovascular risk factors. N/A  Colon cancer screening is now at age 64 but your insurance may not cover until age 69 .screening is recommended age 29. FIT cards  Prostate cancer screening: some controversy, starts usually at 50: Rectal exam and PSA.  Aortic Abdominal Aneurism once between 88 and 68 years old if ever smoker.  Also recommended:  1. Dental visit- Brush and floss your teeth twice daily; visit your dentist twice a year. 2. Eye doctor- Get an eye exam at least every 2 years. 3. Helmet use- Always wear a  helmet when riding a bicycle, motorcycle, rollerblading or skateboarding. 4. Safe sex- If you may be exposed to sexually transmitted infections, use a condom. 5. Seat belts- Seat belts can save your live; always wear one. 6. Smoke/Carbon Monoxide detectors- These detectors need to be installed on the appropriate level of your home. Replace batteries at least once a year. 7. Skin cancer- When out in the sun please cover up and use sunscreen 15 SPF or higher. 8. Violence- If anyone is threatening or hurting you, please tell your healthcare provider.  9. Drink alcohol in moderation- Limit alcohol intake to one drink or less per day. Never drink and drive.

## 2021-04-05 MED ORDER — LISINOPRIL-HYDROCHLOROTHIAZIDE 20-12.5 MG PO TABS: 1.0000 | ORAL_TABLET | Freq: Every day | ORAL | 3 refills | Status: DC

## 2021-04-05 NOTE — Addendum Note (Signed)
Addended by: [place], [employer]anta [employer] G on: 04/05/2021 06:17 PM   Modules accepted: Orders

## 2021-04-07 LAB — HEPATITIS C ANTIBODY
Hepatitis C Ab: NONREACTIVE
SIGNAL TO CUT-OFF: 0.01 (ref <1.00)

## 2021-05-18 ENCOUNTER — Encounter: Payer: Self-pay | Admitting: Family Medicine

## 2021-05-18 DIAGNOSIS — I10 Essential (primary) hypertension: Secondary | ICD-10-CM

## 2021-05-18 MED ORDER — LISINOPRIL-HYDROCHLOROTHIAZIDE 20-12.5 MG PO TABS: 1.0000 | ORAL_TABLET | Freq: Every day | ORAL | 0 refills | Status: DC

## 2021-06-25 ENCOUNTER — Ambulatory Visit (INDEPENDENT_AMBULATORY_CARE_PROVIDER_SITE_OTHER): Payer: No Typology Code available for payment source

## 2021-06-25 ENCOUNTER — Ambulatory Visit: Payer: No Typology Code available for payment source | Admitting: Podiatry

## 2021-06-25 ENCOUNTER — Other Ambulatory Visit: Payer: Self-pay

## 2021-06-25 DIAGNOSIS — S90852A Superficial foreign body, left foot, initial encounter: Secondary | ICD-10-CM | POA: Diagnosis not present

## 2021-06-25 DIAGNOSIS — L301 Dyshidrosis [pompholyx]: Secondary | ICD-10-CM

## 2021-06-25 DIAGNOSIS — B070 Plantar wart: Secondary | ICD-10-CM

## 2021-06-30 ENCOUNTER — Ambulatory Visit: Payer: No Typology Code available for payment source | Admitting: Podiatry

## 2021-06-30 NOTE — Progress Notes (Signed)
  Subjective:  Patient ID: Rodney Beltran, male    DOB: 10/02/1976,  MRN: [id number]  Chief Complaint  Patient presents with   Foot Injury      (xray)Sharp pain from bottom of foot. I thought it was a splinter but may be something else as the sharp pain is now spreading to different locations under the left foot.    45 y.o. male presents with the above complaint. History confirmed with patient.   Objective:  Physical Exam: warm, good capillary refill, no trophic changes or ulcerative lesions, normal DP and PT pulses, and normal sensory exam. Left Foot: Submetatarsal 4 he has a verruca plantaris, no obvious puncture wound throughout the plantar foot there are multiple small areas that appear to be deep fluid-filled lesions versus Porro keratoses Assessment:   1. Verruca plantaris   2. Dyshidrotic foot dermatitis   3. Foreign body in left foot, initial encounter      Plan:  Patient was evaluated and treated and all questions answered.  For the rugae discussed etiology and treatment options for this including Cantharone application which we do not have currently.  Recommend salicylic acid application.  I debrided the lesion to petechial bleeding surface with a chisel blade and destroyed it using salicylic acid cream today.  The other lesions appear not to be consistent with verruca plantaris possible they are early verruca.  It appears to be more similar to dyshidrotic eczema to me.  I recommended biopsy of 1 of these.  He is going to beach this weekend and will return next week for punch biopsy.  Return in 5 days (on 06/30/2021) for skin biopsy .

## 2021-07-02 ENCOUNTER — Ambulatory Visit: Payer: No Typology Code available for payment source | Admitting: Podiatry

## 2021-07-02 ENCOUNTER — Other Ambulatory Visit: Payer: Self-pay

## 2021-07-02 DIAGNOSIS — L301 Dyshidrosis [pompholyx]: Secondary | ICD-10-CM

## 2021-07-02 DIAGNOSIS — B070 Plantar wart: Secondary | ICD-10-CM

## 2021-07-06 ENCOUNTER — Encounter: Payer: Self-pay | Admitting: Podiatry

## 2021-07-06 NOTE — Progress Notes (Signed)
  Subjective:  Patient ID: Rodney Beltran, male    DOB: 15-Feb-1976,  MRN: [id number]  Chief Complaint  Patient presents with   Foot Problem    Skin biopsy lesions on plantar foot    45 y.o. male returns today for skin biopsy  Objective:  Physical Exam: warm, good capillary refill, no trophic changes or ulcerative lesions, normal DP and PT pulses, and normal sensory exam. Left Foot: Submetatarsal 4 he has a verruca plantaris, no obvious puncture wound throughout the plantar foot there are multiple small areas that appear to be deep fluid-filled lesions versus Porro keratoses Assessment:   1. Verruca plantaris   2. Dyshidrotic foot dermatitis      Plan:  Patient was evaluated and treated and all questions answered.  Following a local field block with approximately 5 cc of 1% lidocaine plain with epinephrine 1-100,000, I selected one of the lesions and used a 5 mm punch biopsy to obtain a punch biopsy.  Hemostasis was achieved and the biopsy site was sutured with a 3-0 Prolene suture.  This was sent to pathology for analysis.  He return in 2 weeks for suture removal Return in about 2 weeks (around 07/16/2021) for suture removal.

## 2021-07-16 ENCOUNTER — Other Ambulatory Visit: Payer: Self-pay

## 2021-07-16 ENCOUNTER — Ambulatory Visit: Payer: No Typology Code available for payment source | Admitting: Podiatry

## 2021-07-16 DIAGNOSIS — B070 Plantar wart: Secondary | ICD-10-CM | POA: Diagnosis not present

## 2021-07-16 MED ORDER — FLUOROURACIL 5 % EX CREA: TOPICAL_CREAM | Freq: Two times a day (BID) | CUTANEOUS | 2 refills | Status: DC

## 2021-07-16 MED ORDER — CIMETIDINE 300 MG PO TABS: 300.0000 mg | ORAL_TABLET | Freq: Two times a day (BID) | ORAL | 0 refills | Status: DC

## 2021-07-17 NOTE — Progress Notes (Signed)
  Subjective:  Patient ID: Rodney Beltran, male    DOB: Mar 27, 1976,  MRN: [id number]  Chief Complaint  Patient presents with   Plantar Warts    suture removal     45 y.o. male returns today for f/u after skin biopsy  Objective:  Physical Exam: warm, good capillary refill, no trophic changes or ulcerative lesions, normal DP and PT pulses, and normal sensory exam. Left Foot: Submetatarsal 4 he has a verruca plantaris, mosaic pattern of deep lesions throughout left foot plantar surface, biopsy site healing well.  Path report with  deep involuting wart / myrmecia Assessment:   1. Verruca plantaris       Plan:  Patient was evaluated and treated and all questions answered.  Sutures removed, I reviewed with him the pathology report. Recommend continued treatment with salicylic acid once daily, efudex cream once daily, and tagamet. Will see him back in 2 months for followup.   No follow-ups on file.

## 2021-09-15 ENCOUNTER — Ambulatory Visit: Payer: No Typology Code available for payment source | Admitting: Podiatry

## 2021-09-15 ENCOUNTER — Other Ambulatory Visit: Payer: Self-pay

## 2021-09-15 DIAGNOSIS — B070 Plantar wart: Secondary | ICD-10-CM

## 2021-09-15 NOTE — Progress Notes (Signed)
  Subjective:  Patient ID: Rodney Beltran, male    DOB: 01-Apr-1976,  MRN: [id number]  Chief Complaint  Patient presents with   Plantar Warts    8 week follow up    45 y.o. male returns today for for follow-up he has been using the Tagamet and Efudex cream, doing very well  Objective:  Physical Exam: warm, good capillary refill, no trophic changes or ulcerative lesions, normal DP and PT pulses, and normal sensory exam. Left Foot: Submetatarsal 4 he has a deep involuted verruca,  Path report with  deep involuting wart / myrmecia Assessment:   1. Verruca plantaris       Plan:  Patient was evaluated and treated and all questions answered.   overall much improved foot is doing much better overall doing much better recommend he continue using the Efudex cream and Tagamet.Marland Kitchen  Hopefully resolved within 2 to 3 months.  Return to see me as needed for this or other issues  No follow-ups on file.

## 2021-11-20 ENCOUNTER — Encounter: Payer: Self-pay | Admitting: Family Medicine

## 2022-02-05 ENCOUNTER — Telehealth: Payer: Self-pay

## 2022-02-05 NOTE — Telephone Encounter (Signed)
Last CPE 04/03/21. ? ?LVM instructions for pt to call back to schedule appt for 04/04/22 or after. Also wanted to know flu vaccine status for this year. ?

## 2022-02-19 ENCOUNTER — Ambulatory Visit (INDEPENDENT_AMBULATORY_CARE_PROVIDER_SITE_OTHER): Payer: No Typology Code available for payment source

## 2022-02-19 ENCOUNTER — Ambulatory Visit: Payer: No Typology Code available for payment source | Admitting: Podiatry

## 2022-02-19 ENCOUNTER — Encounter: Payer: Self-pay | Admitting: Podiatry

## 2022-02-19 DIAGNOSIS — M722 Plantar fascial fibromatosis: Secondary | ICD-10-CM | POA: Diagnosis not present

## 2022-02-19 MED ORDER — TRIAMCINOLONE ACETONIDE 10 MG/ML IJ SUSP
10.0000 mg | Freq: Once | INTRAMUSCULAR | Status: AC
  Administered 2022-02-19: 10 mg

## 2022-02-19 MED ORDER — DICLOFENAC SODIUM 75 MG PO TBEC: 75.0000 mg | DELAYED_RELEASE_TABLET | Freq: Two times a day (BID) | ORAL | 2 refills | Status: DC

## 2022-02-19 NOTE — Patient Instructions (Signed)

## 2022-02-21 NOTE — Progress Notes (Signed)
Subjective:  ? ?Patient ID: Rodney Beltran, male   DOB: 46 y.o.   MRN: [id number]  ? ?HPI ?Patient presents stating he has developed a lot of pain in the bottom of his right heel over the last couple months and it is gradually becoming harder to be ambulatory.  Does not remember injury ? ? ?ROS ? ? ?   ?Objective:  ?Physical Exam  ?Neurovascular status intact with inflammation pain underneath the plantar heel right with fluid buildup around the medial band at insertion calcaneus ? ?   ?Assessment:  ?Acute plantar fasciitis right with inflammation fluid of the medial band ? ?   ?Plan:  ?H&P reviewed condition recommended continued conservative care and did sterile prep and injected the fascia 3 mg Kenalog 5 mg Xylocaine and applied fascial bridge to hold the arch up and take put pressure off the plantar fascia at the insertional point of the tendon into the calcaneus.  Reappoint 2 weeks ? ?X-rays indicate that there is spur at the insertional point tendon calcaneus no indication stress fracture arthritis ?   ? ? ?

## 2022-03-05 ENCOUNTER — Ambulatory Visit: Payer: No Typology Code available for payment source | Admitting: Podiatry

## 2022-03-05 ENCOUNTER — Encounter: Payer: Self-pay | Admitting: Podiatry

## 2022-03-05 DIAGNOSIS — M722 Plantar fascial fibromatosis: Secondary | ICD-10-CM

## 2022-03-05 NOTE — Progress Notes (Signed)
Subjective:  ? ?Patient ID: Rodney Beltran, male   DOB: 46 y.o.   MRN: [id number]  ? ?HPI ?Patient states he is having significant improvement of the pain and states that he is walking with a better heel toe gait pattern ? ? ?ROS ? ? ?   ?Objective:  ?Physical Exam  ?Neurovascular status intact with inflammation of the right heel minimal in nature no intense discomfort noted currently and is making progress with this ? ?   ?Assessment:  ?Plantar fasciitis right improved with no current discomfort ? ?   ?Plan:  ?Reviewed condition I have recommended continuation of conservative stretch exercises shoe gear modification anti-inflammatories.  Patient will be seen back to recheck encouraged to call questions concerns ?   ? ? ?

## 2022-04-06 NOTE — Progress Notes (Unsigned)
HPI: [name] is a 46 y.o.male here today for his routine physical examination and follow up.  Last CPE: 04/03/21  Regular exercise: Not consistently. Following a healthy diet: He has not been consistent. His job involves travelling 3-4 times per months and during this time he eats out all the time.  Chronic medical problems: HTN,HLD,IFG,and ED among some.  Immunization History  Administered Date(s) Administered   Moderna Sars-Covid-2 Vaccination 06/05/2020, 12/05/2020   Tdap 11/08/2014   Yellow Fever 11/08/2014   Health Maintenance  Topic Date Due   COLONOSCOPY (Pts 45-75yrs Insurance coverage will need to be confirmed)  Never done   COVID-19 Vaccine (3 - Booster for Moderna series) 04/22/2022 (Originally 01/30/2021)   HIV Screening  04/07/2033 (Originally 04/23/1991)   INFLUENZA VACCINE  06/08/2022   TETANUS/TDAP  11/08/2024   Hepatitis C Screening  Completed   HPV VACCINES  Aged Out   -Concerns and/or follow up today:  Hypertension:  Medications: Lisinopril-HCTZ 20-12.5 mg daily. BP readings at home: Occasionally, when he does not feel well.  Last checked about 3 months ago, BP was 136/94 but he has forgotten taking his medication at that time. Side effects: None. Negative for unusual or severe headache, visual changes, exertional chest pain, or dyspnea.  Lab Results  Component Value Date   CREATININE 1.08 04/03/2021   BUN 19 04/03/2021   NA 139 04/03/2021   K 4.0 04/03/2021   CL 102 04/03/2021   CO2 28 04/03/2021   Lab Results  Component Value Date   CHOL 243 (H) 04/03/2021   HDL 42.40 04/03/2021   LDLCALC 161 (H) 04/03/2021   TRIG 198.0 (H) 04/03/2021   CHOLHDL 6 04/03/2021  Prediabetes: Negative for polydipsia, polyphagia, polyuria.  Lab Results  Component Value Date   HGBA1C 6.2 04/03/2021   ED: He is on Sildenafil 20 mg, he takes 4 tabs at the time but it does not seem to be working as well as he did when he first started. He called  pharmacy and he was told there is a generic sildenafil 100 mg.  Apneas while he is asleep witnessed by his wife. Sleeping about 7 hours, most of the time he feels rested the next day.  Review of Systems  Constitutional:  Negative for activity change, appetite change and fever.  HENT:  Negative for nosebleeds, sore throat, trouble swallowing and voice change.   Eyes:  Negative for redness and visual disturbance.  Respiratory:  Negative for cough, shortness of breath and wheezing.   Cardiovascular:  Negative for chest pain, palpitations and leg swelling.  Gastrointestinal:  Negative for abdominal pain, blood in stool, nausea and vomiting.  Endocrine: Negative for cold intolerance and heat intolerance.  Genitourinary:  Negative for decreased urine volume, dysuria, genital sores, hematuria and testicular pain.  Musculoskeletal:  Negative for gait problem and myalgias.  Skin:  Negative for color change and rash.  Allergic/Immunologic: Negative for environmental allergies.  Neurological:  Negative for syncope, facial asymmetry and weakness.  Hematological:  Negative for adenopathy. Does not bruise/bleed easily.  Psychiatric/Behavioral:  Negative for confusion and sleep disturbance. The patient is not nervous/anxious.    Current Outpatient Medications on File Prior to Visit  Medication Sig Dispense Refill   diclofenac (VOLTAREN) 75 MG EC tablet Take 1 tablet (75 mg total) by mouth 2 (two) times daily. 50 tablet 2   fluorouracil (EFUDEX) 5 % cream Apply topically 2 (two) times daily. 40 g 2   cimetidine (TAGAMET) 300 MG tablet Take  1 tablet (300 mg total) by mouth 2 (two) times daily. 180 tablet 0   No current facility-administered medications on file prior to visit.   Past Medical History:  Diagnosis Date   Hyperlipidemia    Hypertension    Past Surgical History:  Procedure Laterality Date   VASECTOMY     No Known Allergies  Family History  Problem Relation Age of Onset   Lupus  Mother    Mental illness Maternal Aunt    Diabetes Maternal Aunt    Diabetes Maternal Uncle    Diabetes Maternal Grandmother    Hypertension Maternal Grandmother    Hyperlipidemia Maternal Grandmother    Mental illness Maternal Grandfather    Social History   Socioeconomic History   Marital status: Married    Spouse name: Not on file   Number of children: Not on file   Years of education: Not on file   Highest education level: Not on file  Occupational History   Not on file  Tobacco Use   Smoking status: Never   Smokeless tobacco: Never  Vaping Use   Vaping Use: Never used  Substance and Sexual Activity   Alcohol use: No   Drug use: No   Sexual activity: Yes  Other Topics Concern   Not on file  Social History Narrative   Not on file   Social Determinants of Health   Financial Resource Strain: Not on file  Food Insecurity: Not on file  Transportation Needs: Not on file  Physical Activity: Not on file  Stress: Not on file  Social Connections: Not on file   Vitals:   04/07/22 0739  BP: 128/80  Pulse: 77  Resp: 16  Temp: 99.1 F (37.3 C)  SpO2: 97%  Body mass index is 37.9 kg/m. Wt Readings from Last 3 Encounters:  04/07/22 242 lb (109.8 kg)  04/03/21 252 lb 8 oz (114.5 kg)  07/05/20 235 lb (106.6 kg)   Physical Exam Vitals and nursing note reviewed.  Constitutional:      General: He is not in acute distress.    Appearance: He is well-developed.  HENT:     Head: Normocephalic and atraumatic.     Right Ear: Tympanic membrane, ear canal and external ear normal.     Left Ear: Tympanic membrane, ear canal and external ear normal.  Eyes:     Extraocular Movements: Extraocular movements intact.     Conjunctiva/sclera: Conjunctivae normal.     Pupils: Pupils are equal, round, and reactive to light.  Neck:     Thyroid: No thyromegaly.     Trachea: No tracheal deviation.  Cardiovascular:     Rate and Rhythm: Normal rate and regular rhythm.     Pulses:           Dorsalis pedis pulses are 2+ on the right side and 2+ on the left side.     Heart sounds: No murmur heard. Pulmonary:     Effort: Pulmonary effort is normal. No respiratory distress.     Breath sounds: Normal breath sounds.  Abdominal:     Palpations: Abdomen is soft. There is no hepatomegaly or mass.     Tenderness: There is no abdominal tenderness.  Genitourinary:    Comments: No concerns. Musculoskeletal:        General: No tenderness.     Cervical back: Normal range of motion.     Comments: No major deformities appreciated and no signs of synovitis.  Lymphadenopathy:     Cervical:  No cervical adenopathy.     Upper Body:     Right upper body: No supraclavicular adenopathy.     Left upper body: No supraclavicular adenopathy.  Skin:    General: Skin is warm.     Findings: No erythema.  Neurological:     General: No focal deficit present.     Mental Status: He is alert and oriented to person, place, and time.     Cranial Nerves: No cranial nerve deficit.     Sensory: No sensory deficit.     Gait: Gait normal.     Deep Tendon Reflexes:     Reflex Scores:      Bicep reflexes are 2+ on the right side and 2+ on the left side.      Patellar reflexes are 2+ on the right side and 2+ on the left side. Psychiatric:        Mood and Affect: Mood and affect normal.   ASSESSMENT AND PLAN:  Mr.Kholton was seen today for annual exam and follow-up.  Diagnoses and all orders for this visit: Orders Placed This Encounter  Procedures   Cologuard   Lipid panel   Hemoglobin A1c   Comprehensive metabolic panel   TSH   Amb Ref to Medical Weight Management   Ambulatory referral to Sleep Studies   Lab Results  Component Value Date   CREATININE 1.21 04/07/2022   BUN 22 04/07/2022   NA 139 04/07/2022   K 3.8 04/07/2022   CL 101 04/07/2022   CO2 30 04/07/2022   Lab Results  Component Value Date   ALT 35 04/07/2022   AST 24 04/07/2022   ALKPHOS 69 04/07/2022   BILITOT 0.5  04/07/2022   Lab Results  Component Value Date   TSH 1.73 04/07/2022   Lab Results  Component Value Date   CHOL 251 (H) 04/07/2022   HDL 46.00 04/07/2022   LDLCALC 169 (H) 04/07/2022   TRIG 180.0 (H) 04/07/2022   CHOLHDL 5 04/07/2022   Lab Results  Component Value Date   HGBA1C 6.1 04/07/2022   Routine general medical examination at a health care facility We discussed the importance of regular physical activity and healthy diet for prevention of chronic illness and/or complications. Preventive guidelines reviewed. Vaccination up-to-date. Next CPE in a year.  The 10-year ASCVD risk score (Arnett DK, et al., 2019) is: 3.9%   Values used to calculate the score:     Age: 65 years     Sex: Male     Is Non-Hispanic African American: No     Diabetic: No     Tobacco smoker: No     Systolic Blood Pressure: 0000000 mmHg     Is BP treated: Yes     HDL Cholesterol: 46 mg/dL     Total Cholesterol: 251 mg/dL  Colon cancer screening -     Cologuard  Screening for endocrine, metabolic and immunity disorder -     Comprehensive metabolic panel -     Hemoglobin A1c  Sleep apnea, unspecified type Weight loss will definitely help. We discussed possible complications of OSA. Sleep study will be arranged.  Essential hypertension, benign BP adequately controlled. Continue lisinopril-HCTZ 20-12.5 mg daily. Recommend monitoring BP regularly. Low-salt/DASH diet recommended.  Hyperlipidemia Non pharmacologic treatment recommended for now. Further recommendations will be given according to 10 years CVD risk score and lipid panel numbers.  Erectile dysfunction Sildenafil 20 mg 4 tablets at the same time is no longer helping, he would like to  try 100 mg. Sildenafil 100 mg tablets sent to his pharmacy to take once daily as needed. Son side effect discussed.  Severe obesity (BMI 35.0-39.9) with comorbidity ([place]) He has lost about 10 pounds since his last visit. He understands the benefits  of wt loss as well as adverse effects of obesity. Consistency with healthy diet and physical activity encouraged.  Return in 1 year (on 04/08/2023) for CPE and f/u.  Adarsh Mundorf G. [place], MD  [facility]. [place] office.

## 2022-04-07 ENCOUNTER — Ambulatory Visit (INDEPENDENT_AMBULATORY_CARE_PROVIDER_SITE_OTHER): Payer: No Typology Code available for payment source | Admitting: Family Medicine

## 2022-04-07 ENCOUNTER — Encounter: Payer: Self-pay | Admitting: Family Medicine

## 2022-04-07 VITALS — BP 128/80 | HR 77 | Temp 99.1°F | Resp 16 | Ht 67.0 in | Wt 242.0 lb

## 2022-04-07 DIAGNOSIS — N529 Male erectile dysfunction, unspecified: Secondary | ICD-10-CM

## 2022-04-07 DIAGNOSIS — Z1329 Encounter for screening for other suspected endocrine disorder: Secondary | ICD-10-CM

## 2022-04-07 DIAGNOSIS — I10 Essential (primary) hypertension: Secondary | ICD-10-CM | POA: Diagnosis not present

## 2022-04-07 DIAGNOSIS — Z0000 Encounter for general adult medical examination without abnormal findings: Secondary | ICD-10-CM | POA: Diagnosis not present

## 2022-04-07 DIAGNOSIS — Z1211 Encounter for screening for malignant neoplasm of colon: Secondary | ICD-10-CM

## 2022-04-07 DIAGNOSIS — Z130 Encounter for screening for diseases of the blood and blood-forming organs and certain disorders involving the immune mechanism: Secondary | ICD-10-CM | POA: Diagnosis not present

## 2022-04-07 DIAGNOSIS — Z13228 Encounter for screening for other metabolic disorders: Secondary | ICD-10-CM | POA: Diagnosis not present

## 2022-04-07 DIAGNOSIS — E785 Hyperlipidemia, unspecified: Secondary | ICD-10-CM | POA: Diagnosis not present

## 2022-04-07 DIAGNOSIS — G4730 Sleep apnea, unspecified: Secondary | ICD-10-CM

## 2022-04-07 LAB — HEMOGLOBIN A1C: Hgb A1c MFr Bld: 6.1 % (ref 4.6–6.5)

## 2022-04-07 LAB — COMPREHENSIVE METABOLIC PANEL
ALT: 35 U/L (ref 0–53)
AST: 24 U/L (ref 0–37)
Albumin: 4.5 g/dL (ref 3.5–5.2)
Alkaline Phosphatase: 69 U/L (ref 39–117)
BUN: 22 mg/dL (ref 6–23)
CO2: 30 mEq/L (ref 19–32)
Calcium: 9.4 mg/dL (ref 8.4–10.5)
Chloride: 101 mEq/L (ref 96–112)
Creatinine, Ser: 1.21 mg/dL (ref 0.40–1.50)
GFR: 72.08 mL/min (ref >60.00)
Glucose, Bld: 109 mg/dL — ABNORMAL HIGH (ref 70–99)
Potassium: 3.8 mEq/L (ref 3.5–5.1)
Sodium: 139 mEq/L (ref 135–145)
Total Bilirubin: 0.5 mg/dL (ref 0.2–1.2)
Total Protein: 7.3 g/dL (ref 6.0–8.3)

## 2022-04-07 LAB — LIPID PANEL
Cholesterol: 251 mg/dL — ABNORMAL HIGH (ref 0–200)
HDL: 46 mg/dL (ref >39.00)
LDL Cholesterol: 169 mg/dL — ABNORMAL HIGH (ref 0–99)
NonHDL: 205.3
Total CHOL/HDL Ratio: 5
Triglycerides: 180 mg/dL — ABNORMAL HIGH (ref 0.0–149.0)
VLDL: 36 mg/dL (ref 0.0–40.0)

## 2022-04-07 LAB — TSH: TSH: 1.73 u[IU]/mL (ref 0.35–5.50)

## 2022-04-07 MED ORDER — LISINOPRIL-HYDROCHLOROTHIAZIDE 20-12.5 MG PO TABS: 1.0000 | ORAL_TABLET | Freq: Every day | ORAL | 3 refills | Status: DC

## 2022-04-07 MED ORDER — SILDENAFIL CITRATE 100 MG PO TABS: 100.0000 mg | ORAL_TABLET | Freq: Every day | ORAL | 3 refills | Status: DC | PRN

## 2022-04-07 NOTE — Assessment & Plan Note (Addendum)
He has lost about 10 pounds since his last visit. He understands the benefits of wt loss as well as adverse effects of obesity. Consistency with healthy diet and physical activity encouraged. I do not recommend pharmacologic options. He agrees with referral to medical weight management.

## 2022-04-07 NOTE — Assessment & Plan Note (Signed)
BP adequately controlled. Continue lisinopril-HCTZ 20-12.5 mg daily. Recommend monitoring BP regularly. Low-salt/DASH diet recommended.

## 2022-04-07 NOTE — Assessment & Plan Note (Signed)
Non pharmacologic treatment recommended for now. Further recommendations will be given according to 10 years CVD risk score and lipid panel numbers. 

## 2022-04-07 NOTE — Patient Instructions (Addendum)
A few things to remember from today's visit:  Routine general medical examination at a health care facility  Hyperlipidemia, unspecified hyperlipidemia type - Plan: Lipid panel  Essential hypertension, benign - Plan: lisinopril-hydrochlorothiazide (ZESTORETIC) 20-12.5 MG tablet, TSH  Severe obesity (BMI 35.0-39.9) with comorbidity (HCC), Chronic - Plan: Amb Ref to Medical Weight Management  Colon cancer screening - Plan: Cologuard  Screening for endocrine, metabolic and immunity disorder - Plan: Hemoglobin A1c, Comprehensive metabolic panel  Erectile dysfunction, unspecified erectile dysfunction type - Plan: sildenafil (VIAGRA) 100 MG tablet  Sleep apnea, unspecified type - Plan: Ambulatory referral to Sleep Studies  If you need refills please call your pharmacy. Do not use My Chart to request refills or for acute issues that need immediate attention.   Please be sure medication list is accurate. If a new problem present, please set up appointment sooner than planned today.  Health Maintenance, Male Adopting a healthy lifestyle and getting preventive care are important in promoting health and wellness. Ask your health care provider about: The right schedule for you to have regular tests and exams. Things you can do on your own to prevent diseases and keep yourself healthy. What should I know about diet, weight, and exercise? Eat a healthy diet  Eat a diet that includes plenty of vegetables, fruits, low-fat dairy products, and lean protein. Do not eat a lot of foods that are high in solid fats, added sugars, or sodium. Maintain a healthy weight Body mass index (BMI) is a measurement that can be used to identify possible weight problems. It estimates body fat based on height and weight. Your health care provider can help determine your BMI and help you achieve or maintain a healthy weight. Get regular exercise Get regular exercise. This is one of the most important things you can do  for your health. Most adults should: Exercise for at least 150 minutes each week. The exercise should increase your heart rate and make you sweat (moderate-intensity exercise). Do strengthening exercises at least twice a week. This is in addition to the moderate-intensity exercise. Spend less time sitting. Even light physical activity can be beneficial. Watch cholesterol and blood lipids Have your blood tested for lipids and cholesterol at 46 years of age, then have this test every 5 years. You may need to have your cholesterol levels checked more often if: Your lipid or cholesterol levels are high. You are older than 46 years of age. You are at high risk for heart disease. What should I know about cancer screening? Many types of cancers can be detected early and may often be prevented. Depending on your health history and family history, you may need to have cancer screening at various ages. This may include screening for: Colorectal cancer. Prostate cancer. Skin cancer. Lung cancer. What should I know about heart disease, diabetes, and high blood pressure? Blood pressure and heart disease High blood pressure causes heart disease and increases the risk of stroke. This is more likely to develop in people who have high blood pressure readings or are overweight. Talk with your health care provider about your target blood pressure readings. Have your blood pressure checked: Every 3-5 years if you are 38-70 years of age. Every year if you are 7 years old or older. If you are between the ages of 36 and 70 and are a current or former smoker, ask your health care provider if you should have a one-time screening for abdominal aortic aneurysm (AAA). Diabetes Have regular diabetes screenings. This  checks your fasting blood sugar level. Have the screening done: Once every three years after age 66 if you are at a normal weight and have a low risk for diabetes. More often and at a younger age if you  are overweight or have a high risk for diabetes. What should I know about preventing infection? Hepatitis B If you have a higher risk for hepatitis B, you should be screened for this virus. Talk with your health care provider to find out if you are at risk for hepatitis B infection. Hepatitis C Blood testing is recommended for: Everyone born from [birth year] through 1965. Anyone with known risk factors for hepatitis C. Sexually transmitted infections (STIs) You should be screened each year for STIs, including gonorrhea and chlamydia, if: You are sexually active and are younger than 46 years of age. You are older than 46 years of age and your health care provider tells you that you are at risk for this type of infection. Your sexual activity has changed since you were last screened, and you are at increased risk for chlamydia or gonorrhea. Ask your health care provider if you are at risk. Ask your health care provider about whether you are at high risk for HIV. Your health care provider may recommend a prescription medicine to help prevent HIV infection. If you choose to take medicine to prevent HIV, you should first get tested for HIV. You should then be tested every 3 months for as long as you are taking the medicine. Follow these instructions at home: Alcohol use Do not drink alcohol if your health care provider tells you not to drink. If you drink alcohol: Limit how much you have to 0-2 drinks a day. Know how much alcohol is in your drink. In the [place] one drink equals one 12 oz bottle of beer (355 mL), one 5 oz glass of wine (148 mL), or one 1 oz glass of hard liquor (44 mL). Lifestyle Do not use any products that contain nicotine or tobacco. These products include cigarettes, chewing tobacco, and vaping devices, such as e-cigarettes. If you need help quitting, ask your health care provider. Do not use street drugs. Do not share needles. Ask your health care provider for help if you need  support or information about quitting drugs. General instructions Schedule regular health, dental, and eye exams. Stay current with your vaccines. Tell your health care provider if: You often feel depressed. You have ever been abused or do not feel safe at home. Summary Adopting a healthy lifestyle and getting preventive care are important in promoting health and wellness. Follow your health care provider's instructions about healthy diet, exercising, and getting tested or screened for diseases. Follow your health care provider's instructions on monitoring your cholesterol and blood pressure. This information is not intended to replace advice given to you by your health care provider. Make sure you discuss any questions you have with your health care provider. Document Revised: 03/16/2021 Document Reviewed: 03/16/2021 Elsevier Patient Education  2023 [employer].

## 2022-04-07 NOTE — Assessment & Plan Note (Signed)
Sildenafil 20 mg 4 tablets at the same time is no longer helping, he would like to try 100 mg. Sildenafil 100 mg tablets sent to his pharmacy to take once daily as needed. Son side effect discussed.

## 2022-04-14 ENCOUNTER — Other Ambulatory Visit: Payer: Self-pay

## 2022-08-13 ENCOUNTER — Encounter: Payer: Self-pay | Admitting: Family Medicine

## 2022-08-13 ENCOUNTER — Telehealth: Payer: No Typology Code available for payment source | Admitting: Physician Assistant

## 2022-08-13 DIAGNOSIS — S39012A Strain of muscle, fascia and tendon of lower back, initial encounter: Secondary | ICD-10-CM

## 2022-08-13 MED ORDER — CYCLOBENZAPRINE HCL 10 MG PO TABS: 5.0000 mg | ORAL_TABLET | Freq: Three times a day (TID) | ORAL | 0 refills | Status: DC | PRN

## 2022-08-13 MED ORDER — NAPROXEN 500 MG PO TABS: 500.0000 mg | ORAL_TABLET | Freq: Two times a day (BID) | ORAL | 0 refills | Status: DC

## 2022-08-13 NOTE — Progress Notes (Signed)

## 2022-12-10 ENCOUNTER — Ambulatory Visit: Payer: No Typology Code available for payment source | Admitting: Adult Health

## 2022-12-10 VITALS — BP 142/80 | HR 80 | Temp 99.1°F | Ht 67.0 in | Wt 251.0 lb

## 2022-12-10 DIAGNOSIS — R6889 Other general symptoms and signs: Secondary | ICD-10-CM

## 2022-12-10 DIAGNOSIS — I10 Essential (primary) hypertension: Secondary | ICD-10-CM

## 2022-12-10 LAB — POC COVID19 BINAXNOW: SARS Coronavirus 2 Ag: NEGATIVE

## 2022-12-10 LAB — POCT INFLUENZA A/B
Influenza A, POC: NEGATIVE
Influenza B, POC: NEGATIVE

## 2022-12-10 NOTE — Progress Notes (Signed)
Subjective:    Patient ID: Rodney Beltran, male    DOB: 06-Dec-1975, 47 y.o.   MRN: [id number]  Fever  Associated symptoms include coughing.  Cough Associated symptoms include a fever.    47 year old male who  has a past medical history of Hyperlipidemia and Hypertension.  He presents to the office today for flu like symptoms. He reports that his symptoms started roughly 3 days ago. His symptoms include fever up to 101, dry cough, PND, and generalized body aches  Denies n/v/d/shortness of breath or wheezing.   He has been using tylenol/motrin to help with the fever.   BP Readings from Last 3 Encounters:  12/10/22 (!) 142/80  04/07/22 128/80  04/03/21 128/80     Review of Systems  Constitutional:  Positive for fever.  Respiratory:  Positive for cough.    See HPI   Past Medical History:  Diagnosis Date   Hyperlipidemia    Hypertension     Social History   Socioeconomic History   Marital status: Married    Spouse name: Not on file   Number of children: Not on file   Years of education: Not on file   Highest education level: Bachelor's degree (e.g., BA, AB, BS)  Occupational History   Not on file  Tobacco Use   Smoking status: Never   Smokeless tobacco: Never  Vaping Use   Vaping Use: Never used  Substance and Sexual Activity   Alcohol use: No   Drug use: No   Sexual activity: Yes  Other Topics Concern   Not on file  Social History Narrative   Not on file   Social Determinants of Health   Financial Resource Strain: Low Risk  (12/10/2022)   Overall Financial Resource Strain (CARDIA)    Difficulty of Paying Living Expenses: Not hard at all  Food Insecurity: No Food Insecurity (12/10/2022)   Hunger Vital Sign    Worried About Running Out of Food in the Last Year: Never true    Ran Out of Food in the Last Year: Never true  Transportation Needs: No Transportation Needs (12/10/2022)   PRAPARE - Hydrologist (Medical): No     Lack of Transportation (Non-Medical): No  Physical Activity: Sufficiently Active (12/10/2022)   Exercise Vital Sign    Days of Exercise per Week: 3 days    Minutes of Exercise per Session: 60 min  Stress: No Stress Concern Present (12/10/2022)   [place]    Feeling of Stress : Only a little  Social Connections: Moderately Isolated (12/10/2022)   Social Connection and Isolation Panel [NHANES]    Frequency of Communication with Friends and Family: Twice a week    Frequency of Social Gatherings with Friends and Family: Never    Attends Religious Services: More than 4 times per year    Active Member of [employer] or Organizations: No    Attends Music therapist: Not on file    Marital Status: Married  Human resources officer Violence: Not on file    Past Surgical History:  Procedure Laterality Date   VASECTOMY      Family History  Problem Relation Age of Onset   Lupus Mother    Mental illness Maternal Aunt    Diabetes Maternal Aunt    Diabetes Maternal Uncle    Diabetes Maternal Grandmother    Hypertension Maternal Grandmother    Hyperlipidemia Maternal Grandmother  Mental illness Maternal Grandfather     No Known Allergies  Current Outpatient Medications on File Prior to Visit  Medication Sig Dispense Refill   lisinopril-hydrochlorothiazide (ZESTORETIC) 20-12.5 MG tablet Take 1 tablet by mouth daily. 90 tablet 3   sildenafil (VIAGRA) 100 MG tablet Take 1 tablet (100 mg total) by mouth daily as needed for erectile dysfunction. 30 tablet 3   No current facility-administered medications on file prior to visit.    BP (!) 142/80   Pulse 80   Temp 99.1 F (37.3 C) (Oral)   Ht 5\' 7"  (1.702 m)   Wt 251 lb (113.9 kg)   SpO2 95%   BMI 39.31 kg/m       Objective:   Physical Exam Vitals and nursing note reviewed.  Constitutional:      Appearance: Normal appearance.  HENT:     Right Ear: Hearing,  tympanic membrane, ear canal and external ear normal.     Left Ear: Hearing, tympanic membrane, ear canal and external ear normal.     Nose: No congestion or rhinorrhea.     Right Turbinates: Not enlarged or swollen.     Left Turbinates: Not enlarged or swollen.     Mouth/Throat:     Mouth: Mucous membranes are moist.     Pharynx: Oropharynx is clear. Uvula midline. No oropharyngeal exudate or posterior oropharyngeal erythema.     Tonsils: No tonsillar exudate.  Cardiovascular:     Rate and Rhythm: Normal rate and regular rhythm.     Pulses: Normal pulses.     Heart sounds: Normal heart sounds.  Pulmonary:     Effort: Pulmonary effort is normal.     Breath sounds: Normal breath sounds.  Neurological:     Mental Status: He is alert.        Assessment & Plan:  1. Flu-like symptoms - No signs of otitis media, CAP, sinusitis or strep throat. Covid and flu testing was negative - likely viral illness, advised rest, hydration, and can contine with OTC medications. Can take mucinex for cough. Red flags and follow up discussed - POC COVID-19 BinaxNow - POCT Influenza A/B  2. Essential hypertension, benign - BP slightly elevated past baseline. Is taking his medication daily.  - Advised to monitor at home and follow up with PCP.   Dorothyann Peng, NP

## 2023-06-06 ENCOUNTER — Telehealth: Payer: Self-pay | Admitting: Family Medicine

## 2023-06-06 DIAGNOSIS — I10 Essential (primary) hypertension: Secondary | ICD-10-CM

## 2023-06-06 MED ORDER — LISINOPRIL-HYDROCHLOROTHIAZIDE 20-12.5 MG PO TABS: 1.0000 | ORAL_TABLET | Freq: Every day | ORAL | 0 refills | Status: DC

## 2023-06-06 NOTE — Telephone Encounter (Signed)
Rx sent 

## 2023-06-06 NOTE — Telephone Encounter (Signed)
Prescription Request  06/06/2023  LOV: Visit date not found  What is the name of the medication or equipment?  lisinopril-hydrochlorothiazide (ZESTORETIC) 20-12.5 MG tablet  Have you contacted your pharmacy to request a refill? Yes   Which pharmacy would you like this sent to?   [id number] [place], [place] - [address] [place] [address] Phone: [phone] Fax: [phone]    Patient notified that their request is being sent to the clinical staff for review and that they should receive a response within 2 business days.   Please advise at Mobile [phone] (mobile)

## 2023-06-08 NOTE — Progress Notes (Unsigned)
HPI: Rodney Beltran is a 47 y.o.male with PMHx significant for HTN, BMI 38, and HLD here today for his routine physical examination and follow up.  Last CPE: 04/07/22  Regular exercise 3 or more times per week: *** Following a healthy diet: ***  Immunization History  Administered Date(s) Administered   Moderna Sars-Covid-2 Vaccination 06/05/2020, 12/05/2020   Tdap 11/08/2014   Yellow Fever 11/08/2014   Health Maintenance  Topic Date Due   Colonoscopy  Never done   COVID-19 Vaccine (3 - 2023-24 season) 06/26/2023 (Originally 07/09/2022)   INFLUENZA VACCINE  07/27/2023 (Originally 06/09/2023)   HIV Screening  04/07/2033 (Originally 04/23/1991)   DTaP/Tdap/Td (2 - Td or Tdap) 11/08/2024   Hepatitis C Screening  Completed   HPV VACCINES  Aged Out   -Concerns and/or follow up today: ***  Hypertension: Currently he is on lisinopril-HCTZ 20-12.5 mg daily. BP readings at home:***  Negative for unusual or severe headache, visual changes, exertional chest pain, dyspnea,  focal weakness, or edema.  Lab Results  Component Value Date   CREATININE 1.21 04/07/2022   BUN 22 04/07/2022   NA 139 04/07/2022   K 3.8 04/07/2022   CL 101 04/07/2022   CO2 30 04/07/2022   Hyperlipidemia: Currently he is on nonpharmacologic treatment. Lab Results  Component Value Date   CHOL 251 (H) 04/07/2022   HDL 46.00 04/07/2022   LDLCALC 169 (H) 04/07/2022   TRIG 180.0 (H) 04/07/2022   CHOLHDL 5 04/07/2022   Review of Systems  Constitutional:  Negative for activity change, appetite change and fever.  HENT:  Negative for nosebleeds, sore throat and trouble swallowing.   Eyes:  Negative for redness and visual disturbance.  Respiratory:  Negative for cough, shortness of breath and wheezing.   Cardiovascular:  Negative for chest pain, palpitations and leg swelling.  Gastrointestinal:  Negative for abdominal pain, blood in stool, nausea and vomiting.  Endocrine: Negative for cold  intolerance, heat intolerance, polydipsia, polyphagia and polyuria.  Genitourinary:  Negative for decreased urine volume, dysuria, genital sores, hematuria and testicular pain.  Musculoskeletal:  Negative for arthralgias, back pain, joint swelling and myalgias.  Skin:  Negative for color change and rash.  Allergic/Immunologic: Negative for environmental allergies.  Neurological:  Negative for syncope, weakness and headaches.  Hematological:  Negative for adenopathy. Does not bruise/bleed easily.  Psychiatric/Behavioral:  Negative for confusion. The patient is not nervous/anxious.   All other systems reviewed and are negative.  Current Outpatient Medications on File Prior to Visit  Medication Sig Dispense Refill   lisinopril-hydrochlorothiazide (ZESTORETIC) 20-12.5 MG tablet Take 1 tablet by mouth daily. 90 tablet 0   sildenafil (VIAGRA) 100 MG tablet Take 1 tablet (100 mg total) by mouth daily as needed for erectile dysfunction. 30 tablet 3   No current facility-administered medications on file prior to visit.   Past Medical History:  Diagnosis Date   Hyperlipidemia    Hypertension    Past Surgical History:  Procedure Laterality Date   VASECTOMY     No Known Allergies  Family History  Problem Relation Age of Onset   Lupus Mother    Mental illness Maternal Aunt    Diabetes Maternal Aunt    Diabetes Maternal Uncle    Diabetes Maternal Grandmother    Hypertension Maternal Grandmother    Hyperlipidemia Maternal Grandmother    Mental illness Maternal Grandfather    Social History   Socioeconomic History   Marital status: Married    Spouse name: Not on  file   Number of children: Not on file   Years of education: Not on file   Highest education level: Bachelor's degree (e.g., BA, AB, BS)  Occupational History   Not on file  Tobacco Use   Smoking status: Never   Smokeless tobacco: Never  Vaping Use   Vaping status: Never Used  Substance and Sexual Activity   Alcohol  use: No   Drug use: No   Sexual activity: Yes  Other Topics Concern   Not on file  Social History Narrative   Not on file   Social Determinants of Health   Financial Resource Strain: Low Risk  (12/10/2022)   Overall Financial Resource Strain (CARDIA)    Difficulty of Paying Living Expenses: Not hard at all  Food Insecurity: No Food Insecurity (12/10/2022)   Hunger Vital Sign    Worried About Running Out of Food in the Last Year: Never true    Ran Out of Food in the Last Year: Never true  Transportation Needs: No Transportation Needs (12/10/2022)   PRAPARE - Administrator, Civil Service (Medical): No    Lack of Transportation (Non-Medical): No  Physical Activity: Sufficiently Active (12/10/2022)   Exercise Vital Sign    Days of Exercise per Week: 3 days    Minutes of Exercise per Session: 60 min  Stress: No Stress Concern Present (12/10/2022)   [employer] of Occupational Health - Occupational Stress Questionnaire    Feeling of Stress : Only a little  Social Connections: Moderately Isolated (12/10/2022)   Social Connection and Isolation Panel [NHANES]    Frequency of Communication with Friends and Family: Twice a week    Frequency of Social Gatherings with Friends and Family: Never    Attends Religious Services: More than 4 times per year    Active Member of [employer] or Organizations: No    Attends Banker Meetings: Not on file    Marital Status: Married   Vitals:   06/10/23 1533  BP: (!) 120/90  Pulse: 73  Resp: 12  Temp: 98.9 F (37.2 C)  SpO2: 97%   Body mass index is 38.94 kg/m.  Wt Readings from Last 3 Encounters:  06/10/23 248 lb 9.6 oz (112.8 kg)  12/10/22 251 lb (113.9 kg)  04/07/22 242 lb (109.8 kg)   Physical Exam Vitals and nursing note reviewed.  Constitutional:      General: He is not in acute distress.    Appearance: He is well-developed.  HENT:     Head: Normocephalic and atraumatic.     Right Ear: Tympanic membrane, ear canal  and external ear normal.     Left Ear: Tympanic membrane, ear canal and external ear normal.     Mouth/Throat:     Mouth: Mucous membranes are moist.     Pharynx: Oropharynx is clear.  Eyes:     Extraocular Movements: Extraocular movements intact.     Conjunctiva/sclera: Conjunctivae normal.     Pupils: Pupils are equal, round, and reactive to light.  Neck:     Thyroid: No thyromegaly.     Trachea: No tracheal deviation.  Cardiovascular:     Rate and Rhythm: Normal rate and regular rhythm.     Pulses:          Dorsalis pedis pulses are 2+ on the right side and 2+ on the left side.     Heart sounds: No murmur heard. Pulmonary:     Effort: Pulmonary effort is normal. No respiratory  distress.     Breath sounds: Normal breath sounds.  Abdominal:     Palpations: Abdomen is soft. There is no hepatomegaly or mass.     Tenderness: There is no abdominal tenderness.  Genitourinary:    Comments: No concerns. Musculoskeletal:        General: No tenderness.     Cervical back: Normal range of motion.     Comments: No major deformities appreciated and no signs of synovitis.  Lymphadenopathy:     Cervical: No cervical adenopathy.     Upper Body:     Right upper body: No supraclavicular adenopathy.     Left upper body: No supraclavicular adenopathy.  Skin:    General: Skin is warm.     Findings: No erythema.  Neurological:     General: No focal deficit present.     Mental Status: He is alert and oriented to person, place, and time.     Cranial Nerves: No cranial nerve deficit.     Sensory: No sensory deficit.     Gait: Gait normal.     Deep Tendon Reflexes:     Reflex Scores:      Bicep reflexes are 2+ on the right side and 2+ on the left side.      Patellar reflexes are 2+ on the right side and 2+ on the left side. Psychiatric:        Mood and Affect: Mood and affect normal.   ASSESSMENT AND PLAN:  Rodney "Josh" was seen today for annual exam and follow-up.  Diagnoses and all  orders for this visit:  Routine general medical examination at a health care facility  Hyperlipidemia, unspecified hyperlipidemia type -     Cancel: Lipid panel; Future -     Cancel: Comprehensive metabolic panel; Future -     Comprehensive metabolic panel; Future -     Lipid panel; Future -     Lipid panel -     Comprehensive metabolic panel  Screening for endocrine, metabolic and immunity disorder -     Cancel: Hemoglobin A1c; Future -     Hemoglobin A1c; Future -     Hemoglobin A1c  Essential hypertension, benign  Colon cancer screening -     Ambulatory referral to Gastroenterology  Prediabetes -     Cancel: Hemoglobin A1c; Future -     Hemoglobin A1c; Future -     Hemoglobin A1c  Severe obesity (BMI 35.0-39.9) with comorbidity (HCC) -     Amb Ref to Medical Weight Management   Orders Placed This Encounter  Procedures   Comprehensive metabolic panel   Lipid panel   Hemoglobin A1c   Amb Ref to Medical Weight Management   Ambulatory referral to Gastroenterology    Routine general medical examination at a health care facility  Hyperlipidemia, unspecified hyperlipidemia type Assessment & Plan: He is not interested in trying statin medication. He would like to try OTC supplementation and have fasting lipid panel repeated in 3 to 4 months.  Orders: -     Comprehensive metabolic panel; Future -     Lipid panel; Future  Screening for endocrine, metabolic and immunity disorder -     Hemoglobin A1c; Future  Essential hypertension, benign Assessment & Plan: Today DBP mildly elevated. Recommend monitoring BP at home regularly, goal BP < 130/80. Continue lisinopril-HCTZ 20-12.5 mg daily for Low-salt diet also recommended. Eye exam is current.   Colon cancer screening -     Ambulatory referral to Gastroenterology  Prediabetes -     Hemoglobin A1c; Future  Severe obesity (BMI 35.0-39.9) with comorbidity [facility]) Assessment & Plan: Patient understands the  benefits of wt loss as well as adverse effects of obesity. Consistency with healthy diet and physical activity encouraged. He would like to establish with healthy weight and wellness clinic, referral placed.  Orders: -     Amb Ref to Medical Weight Management  Would like to try OTC med for HLD *** Return in 1 year (on 06/09/2024) for CPE.  Caydon Feasel G. [place], MD  [facility]. [place] office.

## 2023-06-10 ENCOUNTER — Ambulatory Visit (INDEPENDENT_AMBULATORY_CARE_PROVIDER_SITE_OTHER): Payer: No Typology Code available for payment source | Admitting: Family Medicine

## 2023-06-10 ENCOUNTER — Encounter: Payer: Self-pay | Admitting: Family Medicine

## 2023-06-10 VITALS — BP 120/90 | HR 73 | Temp 98.9°F | Resp 12 | Ht 67.0 in | Wt 248.6 lb

## 2023-06-10 DIAGNOSIS — R7303 Prediabetes: Secondary | ICD-10-CM | POA: Diagnosis not present

## 2023-06-10 DIAGNOSIS — Z130 Encounter for screening for diseases of the blood and blood-forming organs and certain disorders involving the immune mechanism: Secondary | ICD-10-CM

## 2023-06-10 DIAGNOSIS — Z13228 Encounter for screening for other metabolic disorders: Secondary | ICD-10-CM

## 2023-06-10 DIAGNOSIS — Z0000 Encounter for general adult medical examination without abnormal findings: Secondary | ICD-10-CM

## 2023-06-10 DIAGNOSIS — E785 Hyperlipidemia, unspecified: Secondary | ICD-10-CM | POA: Diagnosis not present

## 2023-06-10 DIAGNOSIS — Z1329 Encounter for screening for other suspected endocrine disorder: Secondary | ICD-10-CM | POA: Diagnosis not present

## 2023-06-10 DIAGNOSIS — Z1211 Encounter for screening for malignant neoplasm of colon: Secondary | ICD-10-CM

## 2023-06-10 DIAGNOSIS — I10 Essential (primary) hypertension: Secondary | ICD-10-CM

## 2023-06-10 NOTE — Assessment & Plan Note (Signed)
Patient understands the benefits of wt loss as well as adverse effects of obesity. Consistency with healthy diet and physical activity encouraged. He would like to establish with healthy weight and wellness clinic, referral placed.

## 2023-06-10 NOTE — Assessment & Plan Note (Addendum)
He is not interested in trying statin medication. He would like to try OTC supplementation and have fasting lipid panel repeated in 3 to 4 months.

## 2023-06-10 NOTE — Assessment & Plan Note (Signed)
Today DBP mildly elevated. Recommend monitoring BP at home regularly, goal BP < 130/80. Continue lisinopril-HCTZ 20-12.5 mg daily for Low-salt diet also recommended. Eye exam is current. He would like to continue annual follow ups.

## 2023-06-10 NOTE — Patient Instructions (Addendum)
A few things to remember from today's visit:  Routine general medical examination at a health care facility  Hyperlipidemia, unspecified hyperlipidemia type - Plan: Lipid panel, Comprehensive metabolic panel  Screening for endocrine, metabolic and immunity disorder - Plan: Hemoglobin A1c  Essential hypertension, benign  Colon cancer screening - Plan: Ambulatory referral to Gastroenterology  Prediabetes - Plan: Hemoglobin A1c  Severe obesity (BMI 35.0-39.9) with comorbidity (HCC), Chronic - Plan: Amb Ref to Medical Weight Management  If you need refills for medications you take chronically, please call your pharmacy. Do not use My Chart to request refills or for acute issues that need immediate attention. If you send a my chart message, it may take a few days to be addressed, specially if I am not in the office.  Please be sure medication list is accurate. If a new problem present, please set up appointment sooner than planned today.  Health Maintenance, Male Adopting a healthy lifestyle and getting preventive care are important in promoting health and wellness. Ask your health care provider about: The right schedule for you to have regular tests and exams. Things you can do on your own to prevent diseases and keep yourself healthy. What should I know about diet, weight, and exercise? Eat a healthy diet  Eat a diet that includes plenty of vegetables, fruits, low-fat dairy products, and lean protein. Do not eat a lot of foods that are high in solid fats, added sugars, or sodium. Maintain a healthy weight Body mass index (BMI) is a measurement that can be used to identify possible weight problems. It estimates body fat based on height and weight. Your health care provider can help determine your BMI and help you achieve or maintain a healthy weight. Get regular exercise Get regular exercise. This is one of the most important things you can do for your health. Most adults  should: Exercise for at least 150 minutes each week. The exercise should increase your heart rate and make you sweat (moderate-intensity exercise). Do strengthening exercises at least twice a week. This is in addition to the moderate-intensity exercise. Spend less time sitting. Even light physical activity can be beneficial. Watch cholesterol and blood lipids Have your blood tested for lipids and cholesterol at 47 years of age, then have this test every 5 years. You may need to have your cholesterol levels checked more often if: Your lipid or cholesterol levels are high. You are older than 47 years of age. You are at high risk for heart disease. What should I know about cancer screening? Many types of cancers can be detected early and may often be prevented. Depending on your health history and family history, you may need to have cancer screening at various ages. This may include screening for: Colorectal cancer. Prostate cancer. Skin cancer. Lung cancer. What should I know about heart disease, diabetes, and high blood pressure? Blood pressure and heart disease High blood pressure causes heart disease and increases the risk of stroke. This is more likely to develop in people who have high blood pressure readings or are overweight. Talk with your health care provider about your target blood pressure readings. Have your blood pressure checked: Every 3-5 years if you are 31-42 years of age. Every year if you are 67 years old or older. If you are between the ages of 71 and 85 and are a current or former smoker, ask your health care provider if you should have a one-time screening for abdominal aortic aneurysm (AAA). Diabetes Have regular  diabetes screenings. This checks your fasting blood sugar level. Have the screening done: Once every three years after age 38 if you are at a normal weight and have a low risk for diabetes. More often and at a younger age if you are overweight or have a high  risk for diabetes. What should I know about preventing infection? Hepatitis B If you have a higher risk for hepatitis B, you should be screened for this virus. Talk with your health care provider to find out if you are at risk for hepatitis B infection. Hepatitis C Blood testing is recommended for: Everyone born from [birth year] through 1965. Anyone with known risk factors for hepatitis C. Sexually transmitted infections (STIs) You should be screened each year for STIs, including gonorrhea and chlamydia, if: You are sexually active and are younger than 47 years of age. You are older than 47 years of age and your health care provider tells you that you are at risk for this type of infection. Your sexual activity has changed since you were last screened, and you are at increased risk for chlamydia or gonorrhea. Ask your health care provider if you are at risk. Ask your health care provider about whether you are at high risk for HIV. Your health care provider may recommend a prescription medicine to help prevent HIV infection. If you choose to take medicine to prevent HIV, you should first get tested for HIV. You should then be tested every 3 months for as long as you are taking the medicine. Follow these instructions at home: Alcohol use Do not drink alcohol if your health care provider tells you not to drink. If you drink alcohol: Limit how much you have to 0-2 drinks a day. Know how much alcohol is in your drink. In the [place] one drink equals one 12 oz bottle of beer (355 mL), one 5 oz glass of wine (148 mL), or one 1 oz glass of hard liquor (44 mL). Lifestyle Do not use any products that contain nicotine or tobacco. These products include cigarettes, chewing tobacco, and vaping devices, such as e-cigarettes. If you need help quitting, ask your health care provider. Do not use street drugs. Do not share needles. Ask your health care provider for help if you need support or information about  quitting drugs. General instructions Schedule regular health, dental, and eye exams. Stay current with your vaccines. Tell your health care provider if: You often feel depressed. You have ever been abused or do not feel safe at home. Summary Adopting a healthy lifestyle and getting preventive care are important in promoting health and wellness. Follow your health care provider's instructions about healthy diet, exercising, and getting tested or screened for diseases. Follow your health care provider's instructions on monitoring your cholesterol and blood pressure. This information is not intended to replace advice given to you by your health care provider. Make sure you discuss any questions you have with your health care provider. Document Revised: 03/16/2021 Document Reviewed: 03/16/2021 Elsevier Patient Education  2024 [employer].

## 2023-06-11 NOTE — Assessment & Plan Note (Signed)
We discussed the importance of regular physical activity and healthy diet for prevention of chronic illness and/or complications. Preventive guidelines reviewed. Vaccination up to date. GI referral placed to discussed colon cancer screening with colonoscopy. Next CPE in a year.

## 2023-06-11 NOTE — Assessment & Plan Note (Signed)
A healthier life style encouraged for diabetes prevention. Further recommendations will be given according to HgA1C result.

## 2023-07-14 ENCOUNTER — Encounter: Payer: Self-pay | Admitting: Nurse Practitioner

## 2023-07-14 ENCOUNTER — Ambulatory Visit: Payer: No Typology Code available for payment source | Admitting: Nurse Practitioner

## 2023-07-14 VITALS — BP 136/84 | HR 70 | Temp 98.9°F | Ht 67.0 in | Wt 247.0 lb

## 2023-07-14 DIAGNOSIS — I10 Essential (primary) hypertension: Secondary | ICD-10-CM | POA: Diagnosis not present

## 2023-07-14 DIAGNOSIS — Z6838 Body mass index (BMI) 38.0-38.9, adult: Secondary | ICD-10-CM

## 2023-07-14 DIAGNOSIS — E785 Hyperlipidemia, unspecified: Secondary | ICD-10-CM | POA: Diagnosis not present

## 2023-07-14 DIAGNOSIS — Z0289 Encounter for other administrative examinations: Secondary | ICD-10-CM

## 2023-07-14 NOTE — Progress Notes (Signed)
Office: [phone]  /  Fax: [phone]   Initial Visit  Rodney Beltran was seen in clinic today to evaluate for obesity. He is interested in losing weight to improve overall health and reduce the risk of weight related complications. He presents today to review program treatment options, initial physical assessment, and evaluation.     He was referred by: PCP  When asked what else they would like to accomplish? He states: Improve existing medical conditions, Reduce number of medications, Improve quality of life, and Lose a target amount of weight : Goal weight: 180-190 lbs   Weight history:  He has struggled with his weight since he was a child.  He started gaining excess weight after he got married and after having his children. His weight has fluctuated over the years.    When asked how has your weight affected you? He states: Contributed to orthopedic problems or mobility issues  Some associated conditions: Hypertension, hyperlipidemia, prediabetes  Contributing factors: Family history, Medications, and Reduced physical activity  Weight promoting medications identified: Steroids  Current nutrition plan: None  Current level of physical activity: Walking  Current or previous pharmacotherapy: None  Response to medication: Never tried medications   Past medical history includes:   Past Medical History:  Diagnosis Date   Hyperlipidemia    Hypertension      Objective:   BP 136/84   Pulse 70   Temp 98.9 F (37.2 C)   Ht 5\' 7"  (1.702 m)   Wt 247 lb (112 kg)   SpO2 96%   BMI 38.69 kg/m  He was weighed on the bioimpedance scale: Body mass index is 38.69 kg/m.  Peak Weight:255 lbs , Body Fat%:30.6, Visceral Fat Rating:17, Weight trend over the last 12 months: fluctuated   General:  Alert, oriented and cooperative. Patient is in no acute distress.  Respiratory: Normal respiratory effort, no problems with respiration noted   Gait: able to ambulate  independently  Mental Status: Normal mood and affect. Normal behavior. Normal judgment and thought content.   DIAGNOSTIC DATA REVIEWED:  BMET    Component Value Date/Time   NA 138 06/10/2023 1613   K 3.9 06/10/2023 1613   CL 99 06/10/2023 1613   CO2 31 06/10/2023 1613   GLUCOSE 86 06/10/2023 1613   BUN 11 06/10/2023 1613   CREATININE 1.10 06/10/2023 1613   CALCIUM 10.5 (H) 06/10/2023 1613   Lab Results  Component Value Date   HGBA1C 6.2 (H) 06/10/2023   HGBA1C 5.9 03/16/2016   No results found for: "INSULIN" CBC    Component Value Date/Time   WBC 7.4 10/11/2013 1917   RBC 5.40 10/11/2013 1917   HGB 15.3 10/11/2013 1947   HCT 45.0 10/11/2013 1947   PLT 284 10/11/2013 1917   MCV 84.1 10/11/2013 1917   MCH 29.1 10/11/2013 1917   MCHC 34.6 10/11/2013 1917   RDW 13.2 10/11/2013 1917   Iron/TIBC/Ferritin/ %Sat No results found for: "IRON", "TIBC", "FERRITIN", "IRONPCTSAT" Lipid Panel     Component Value Date/Time   CHOL 270 (H) 06/10/2023 1613   TRIG 181 (H) 06/10/2023 1613   HDL 48 06/10/2023 1613   CHOLHDL 5.6 (H) 06/10/2023 1613   VLDL 36.0 04/07/2022 0812   LDLCALC 187 (H) 06/10/2023 1613   Hepatic Function Panel     Component Value Date/Time   PROT 7.5 06/10/2023 1613   ALBUMIN 4.5 04/07/2022 0812   AST 24 06/10/2023 1613   ALT 30 06/10/2023 1613   ALKPHOS 69 04/07/2022  0981   BILITOT 0.7 06/10/2023 1613      Component Value Date/Time   TSH 1.73 04/07/2022 1914     Assessment and Plan:   Essential hypertension, benign Continue to follow up with PCP. Continue meds as directed  Hyperlipidemia, unspecified hyperlipidemia type Continue to follow up with PCP.  Morbid obesity (HCC)  BMI 38.0-38.9,adult        Obesity Treatment / Action Plan:  Patient will work on garnering support from family and friends to begin weight loss journey. Will work on eliminating or reducing the presence of highly palatable, calorie dense foods in the home. Will  complete provided nutritional and psychosocial assessment questionnaire before the next appointment. Will be scheduled for indirect calorimetry to determine resting energy expenditure in a fasting state.  This will allow [place] to create a reduced calorie, high-protein meal plan to promote loss of fat mass while preserving muscle mass. Counseled on the health benefits of losing 5%-15% of total body weight. Was counseled on nutritional approaches to weight loss and benefits of reducing processed foods and consuming plant-based foods and high quality protein as part of nutritional weight management. Was counseled on pharmacotherapy and role as an adjunct in weight management.   Obesity Education Performed Today:  He was weighed on the bioimpedance scale and results were discussed and documented in the synopsis.  We discussed obesity as a disease and the importance of a more detailed evaluation of all the factors contributing to the disease.  We discussed the importance of long term lifestyle changes which include nutrition, exercise and behavioral modifications as well as the importance of customizing this to his specific health and social needs.  We discussed the benefits of reaching a healthier weight to alleviate the symptoms of existing conditions and reduce the risks of the biomechanical, metabolic and psychological effects of obesity.  Rodney Beltran appears to be in the action stage of change and states they are ready to start intensive lifestyle modifications and behavioral modifications.  30 minutes was spent today on this visit including the above counseling, pre-visit chart review, and post-visit documentation.  Reviewed by clinician on day of visit: allergies, medications, problem list, medical history, surgical history, family history, social history, and previous encounter notes pertinent to obesity diagnosis.    Theodis Sato Keimora Swartout FNP-C

## 2023-07-27 ENCOUNTER — Ambulatory Visit: Payer: No Typology Code available for payment source | Admitting: Bariatrics

## 2023-07-27 ENCOUNTER — Encounter: Payer: Self-pay | Admitting: Bariatrics

## 2023-07-27 VITALS — BP 155/104 | HR 63 | Temp 98.0°F | Ht 67.0 in | Wt 250.0 lb

## 2023-07-27 DIAGNOSIS — I10 Essential (primary) hypertension: Secondary | ICD-10-CM

## 2023-07-27 DIAGNOSIS — R7303 Prediabetes: Secondary | ICD-10-CM | POA: Diagnosis not present

## 2023-07-27 DIAGNOSIS — E559 Vitamin D deficiency, unspecified: Secondary | ICD-10-CM | POA: Diagnosis not present

## 2023-07-27 DIAGNOSIS — Z6839 Body mass index (BMI) 39.0-39.9, adult: Secondary | ICD-10-CM

## 2023-07-27 DIAGNOSIS — R5383 Other fatigue: Secondary | ICD-10-CM

## 2023-07-27 DIAGNOSIS — E7849 Other hyperlipidemia: Secondary | ICD-10-CM | POA: Diagnosis not present

## 2023-07-27 DIAGNOSIS — R0602 Shortness of breath: Secondary | ICD-10-CM | POA: Diagnosis not present

## 2023-07-27 DIAGNOSIS — Z0000 Encounter for general adult medical examination without abnormal findings: Secondary | ICD-10-CM | POA: Insufficient documentation

## 2023-07-27 DIAGNOSIS — Z1331 Encounter for screening for depression: Secondary | ICD-10-CM | POA: Diagnosis not present

## 2023-07-28 ENCOUNTER — Encounter: Payer: Self-pay | Admitting: Bariatrics

## 2023-07-28 DIAGNOSIS — E88819 Insulin resistance, unspecified: Secondary | ICD-10-CM | POA: Insufficient documentation

## 2023-07-28 LAB — VITAMIN D 25 HYDROXY (VIT D DEFICIENCY, FRACTURES): Vit D, 25-Hydroxy: 26.5 ng/mL — ABNORMAL LOW (ref 30.0–100.0)

## 2023-07-28 LAB — TSH+T4F+T3FREE
Free T4: 0.95 ng/dL (ref 0.82–1.77)
T3, Free: 3.4 pg/mL (ref 2.0–4.4)
TSH: 1.51 u[IU]/mL (ref 0.450–4.500)

## 2023-07-28 LAB — INSULIN, RANDOM: INSULIN: 17.7 u[IU]/mL (ref 2.6–24.9)

## 2023-07-28 NOTE — Progress Notes (Signed)
Chief Complaint:   OBESITY Rodney Beltran (MR# [id number]) is a 47 y.o. male who presents for evaluation and treatment of obesity and related comorbidities. Current BMI is Body mass index is 39.16 kg/m. Rodney Beltran has been struggling with his weight for many years and has been unsuccessful in either losing weight, maintaining weight loss, or reaching his healthy weight goal.  Rodney Beltran is currently in the action stage of change and ready to dedicate time achieving and maintaining a healthier weight. Rodney Beltran is interested in becoming our patient and working on intensive lifestyle modifications including (but not limited to) diet and exercise for weight loss.  Patient met with Irene Limbo, NP on 07/14/2023.  He likes to cook and does not like eating out.  He snacks at night.  Elfego's habits were reviewed today and are as follows: His family eats meals together, he thinks his family will eat healthier with him, his desired weight loss is 70 lbs, he has been heavy most of his life, he started gaining weight 85-68 years old, his heaviest weight ever was 255 pounds, he has significant food cravings issues, he snacks frequently in the evenings, he skips meals frequently, he is frequently drinking liquids with calories, he frequently makes poor food choices, he frequently eats larger portions than normal, and he struggles with emotional eating.  Depression Screen Sajjad's Food and Mood (modified PHQ-9) score was 7.  Subjective:   1. Other fatigue Rodney Beltran admits to daytime somnolence and admits to waking up still tired. Patient has a history of symptoms of daytime fatigue and morning fatigue. Rodney Beltran generally gets 6 or 7 hours of sleep per night, and states that he has nightime awakenings. Snoring is present. Apneic episodes are present. Epworth Sleepiness Score is 8.   2. SOB (shortness of breath) on exertion Rodney Beltran notes increasing shortness of breath with exercising and seems to be worsening  over time with weight gain. He notes getting out of breath sooner with activity than he used to. This has not gotten worse recently. Rodney Beltran denies shortness of breath at rest or orthopnea.  3. Essential hypertension, benign Patient is taking lisinopril-hydrochlorothiazide 20-12.5 mg.  He did not take his blood pressure medications yesterday.  4. Prediabetes Patient is not on medications currently.  He has a maternal family history.  His recent A1c was 6.2.  5. Health care maintenance Given obesity.   6. Vitamin D deficiency Patient is not on vitamin D supplementation.  7. Other hyperlipidemia Patient is not on medications currently (does not want statins). His cholesterol is elevated.   Assessment/Plan:   1. Other fatigue Rodney Beltran does feel that his weight is causing his energy to be lower than it should be. Fatigue may be related to obesity, depression or many other causes. Labs will be ordered, and in the meanwhile, Rodney Beltran will focus on self care including making healthy food choices, increasing physical activity and focusing on stress reduction.  - EKG 12-Lead - TSH+T4F+T3Free  2. SOB (shortness of breath) on exertion Rodney Beltran does feel that he gets out of breath more easily that he used to when he exercises. Rodney Beltran's shortness of breath appears to be obesity related and exercise induced. He has agreed to work on weight loss and gradually increase exercise to treat his exercise induced shortness of breath. Will continue to monitor closely.  - TSH+T4F+T3Free  3. Essential hypertension, benign Patient will continue his medications.  He will work on minimizing salt to 1500 mg daily.  4. Prediabetes Rodney Beltran  will check labs today.  Patient will work on keeping all carbohydrates low (sweets and starches).   - Insulin, random  5. Health care maintenance Rodney Beltran will check labs today. EKG and IC were done today and reviewed with the patient.   - Insulin, random - VITAMIN D 25 Hydroxy (Vit-D  Deficiency, Fractures) - TSH+T4F+T3Free  6. Vitamin D deficiency Rodney Beltran will check labs today, and Rodney Beltran will follow-up at patient's next visit.  - VITAMIN D 25 Hydroxy (Vit-D Deficiency, Fractures)  7. Other hyperlipidemia Patient will work on eliminating trans fats and keep saturated fats low.  8. Depression screening Rodney Beltran had a positive depression screening. Depression is commonly associated with obesity and often results in emotional eating behaviors. Rodney Beltran will monitor this closely and work on CBT to help improve the non-hunger eating patterns. Referral to Psychology may be required if no improvement is seen as he continues in our clinic.  9. Morbid obesity (HCC)  10. Severe obesity (BMI 35.0-39.9) with comorbidity (HCC) Rodney Beltran is currently in the action stage of change and his goal is to continue with weight loss efforts. I recommend Rodney Beltran begin the structured treatment plan as follows:  He has agreed to the Category 3 Plan.  Meal planning was discussed.  Review labs with the patient from 06/10/2023 CMP, lipids, glucose, and A1c.  Patient will pick better snacks and will stay well-hydrated.  Exercise goals: Walking the dog.    Behavioral modification strategies: increasing lean protein intake, decreasing simple carbohydrates, increasing vegetables, increasing water intake, decreasing eating out, no skipping meals, meal planning and cooking strategies, keeping healthy foods in the home, holiday eating strategies , and celebration eating strategies.  He was informed of the importance of frequent follow-up visits to maximize his success with intensive lifestyle modifications for his multiple health conditions. He was informed Rodney Beltran would discuss his lab results at his next visit unless there is a critical issue that needs to be addressed sooner. Rodney Beltran agreed to keep his next visit at the agreed upon time to discuss these results.  Objective:   Blood pressure (!) 155/104, pulse 63, temperature 98  F (36.7 C), height 5\' 7"  (1.702 m), weight 250 lb (113.4 kg), SpO2 95%. Body mass index is 39.16 kg/m.  EKG: Normal sinus rhythm, rate 67 BPM.  Indirect Calorimeter completed today shows a VO2 of 332 and a REE of 2290.  His calculated basal metabolic rate is 1610 thus his basal metabolic rate is better than expected.  General: Cooperative, alert, well developed, in no acute distress. HEENT: Conjunctivae and lids unremarkable. Cardiovascular: Regular rhythm.  Lungs: Normal work of breathing. Neurologic: No focal deficits.   Lab Results  Component Value Date   CREATININE 1.10 06/10/2023   BUN 11 06/10/2023   NA 138 06/10/2023   K 3.9 06/10/2023   CL 99 06/10/2023   CO2 31 06/10/2023   Lab Results  Component Value Date   ALT 30 06/10/2023   AST 24 06/10/2023   ALKPHOS 69 04/07/2022   BILITOT 0.7 06/10/2023   Lab Results  Component Value Date   HGBA1C 6.2 (H) 06/10/2023   HGBA1C 6.1 04/07/2022   HGBA1C 6.2 04/03/2021   HGBA1C 5.8 09/14/2019   HGBA1C 5.8 08/18/2018   Lab Results  Component Value Date   INSULIN 17.7 07/27/2023   Lab Results  Component Value Date   TSH 1.510 07/27/2023   Lab Results  Component Value Date   CHOL 270 (H) 06/10/2023   HDL 48 06/10/2023  LDLCALC 187 (H) 06/10/2023   TRIG 181 (H) 06/10/2023   CHOLHDL 5.6 (H) 06/10/2023   Lab Results  Component Value Date   WBC 7.4 10/11/2013   HGB 15.3 10/11/2013   HCT 45.0 10/11/2013   MCV 84.1 10/11/2013   PLT 284 10/11/2013   No results found for: "IRON", "TIBC", "FERRITIN"  Attestation Statements:   Reviewed by clinician on day of visit: allergies, medications, problem list, medical history, surgical history, family history, social history, and previous encounter notes.   Time spent on visit including pre-visit chart review and post-visit charting and care was 40 minutes.    Trude Mcburney, am acting as Energy manager for [employer], DO.  I have reviewed the above documentation  for accuracy and completeness, and I agree with the above. Corinna Capra, DO

## 2023-08-15 ENCOUNTER — Encounter: Payer: Self-pay | Admitting: Bariatrics

## 2023-08-15 ENCOUNTER — Ambulatory Visit: Payer: No Typology Code available for payment source | Admitting: Bariatrics

## 2023-08-15 VITALS — BP 145/88 | HR 63 | Temp 98.2°F | Ht 67.0 in | Wt 240.0 lb

## 2023-08-15 DIAGNOSIS — E559 Vitamin D deficiency, unspecified: Secondary | ICD-10-CM

## 2023-08-15 DIAGNOSIS — E88819 Insulin resistance, unspecified: Secondary | ICD-10-CM

## 2023-08-15 DIAGNOSIS — E66812 Obesity, class 2: Secondary | ICD-10-CM

## 2023-08-15 DIAGNOSIS — I10 Essential (primary) hypertension: Secondary | ICD-10-CM | POA: Diagnosis not present

## 2023-08-15 DIAGNOSIS — R7303 Prediabetes: Secondary | ICD-10-CM | POA: Diagnosis not present

## 2023-08-15 DIAGNOSIS — Z6838 Body mass index (BMI) 38.0-38.9, adult: Secondary | ICD-10-CM

## 2023-08-15 DIAGNOSIS — E6609 Other obesity due to excess calories: Secondary | ICD-10-CM | POA: Insufficient documentation

## 2023-08-15 MED ORDER — VITAMIN D (ERGOCALCIFEROL) 1.25 MG (50000 UNIT) PO CAPS
50000.0000 [IU] | ORAL_CAPSULE | ORAL | 0 refills | Status: AC
Start: 2023-08-15 — End: ?

## 2023-08-16 ENCOUNTER — Encounter: Payer: Self-pay | Admitting: Bariatrics

## 2023-08-16 NOTE — Progress Notes (Signed)
Chief Complaint:   OBESITY Rodney Beltran is here to discuss his progress with his obesity treatment plan along with follow-up of his obesity related diagnoses. Rodney Beltran is on the Category 3 Plan and states he is following his eating plan approximately 90% of the time. Rodney Beltran states he is walking the dog for 45 minutes 3 times per week.  Today's visit was #: 2 Starting weight: 250 lbs Starting date: 07/27/2023 Today's weight: 240 lbs Today's date: 08/16/2023 Total lbs lost to date: 10 Total lbs lost since last in-office visit: 10  Interim History: Patient is down 10 lbs since his first visit. He travels for work which makes things difficult. He is feeling full.   Subjective:   1. Vitamin D deficiency Patient is not on Vitamin D, and his recent Vit D level was 26.5.  2. Insulin resistance Patient is not on medications. His recent insulin level was 17.7.  3. Pre-diabetes Patient is not on medications. His A1c was 6.2 on 06/10/2023.  4. Essential hypertension Patient is taking Zestoretic 20-12.5 mg once daily. His blood pressure is 145/88 today, but lower than his last visit.    Assessment/Plan:   1. Vitamin D deficiency Patient agreed to start prescription Vitamin D 50,000 IU once weekly with a 90 day supply.   - Vitamin D, Ergocalciferol, (DRISDOL) 1.25 MG (50000 UNIT) CAPS capsule; Take 1 capsule (50,000 Units total) by mouth every 7 (seven) days.  Dispense: 12 capsule; Refill: 0  2. Insulin resistance Goal is <10. Information handouts on pre-diabetes and insulin resistance, as well as metformin was given to the patient today.   3. Pre-diabetes Eating Out guide and Travel guide was given to the patient today.   4. Essential hypertension Patient will continue his medications, and he will check his blood pressure at home.   5. Class 2 obesity due to excess calories with body mass index (BMI) of 38.0 to 38.9 in adult, unspecified whether serious comorbidity present Rodney Beltran is  currently in the action stage of change. As such, his goal is to continue with weight loss efforts. He has agreed to the Category 3 Plan and keeping a food journal and adhering to recommended goals of 1500 calories and 90-120 grams of protein daily.   Reviewed labs with the patient from 06/10/2023; CMP and lipids, 07/27/2023; Vit D, insulin, and thyroid panel. Patient will continue to adhere to the plan 85-95%. Travel tips was given.   Exercise goals: As is.   Behavioral modification strategies: increasing lean protein intake, decreasing simple carbohydrates, increasing vegetables, increasing water intake, decreasing eating out, no skipping meals, meal planning and cooking strategies, keeping healthy foods in the home, and planning for success.  Rodney Beltran has agreed to follow-up with our clinic in 2 weeks with Irene Limbo, FNP-C. He was informed of the importance of frequent follow-up visits to maximize his success with intensive lifestyle modifications for his multiple health conditions.   Objective:   Blood pressure (!) 145/88, pulse 63, temperature 98.2 F (36.8 C), height 5\' 7"  (1.702 m), weight 240 lb (108.9 kg), SpO2 95%. Body mass index is 37.59 kg/m.  General: Cooperative, alert, well developed, in no acute distress. HEENT: Conjunctivae and lids unremarkable. Cardiovascular: Regular rhythm.  Lungs: Normal work of breathing. Neurologic: No focal deficits.   Lab Results  Component Value Date   CREATININE 1.10 06/10/2023   BUN 11 06/10/2023   NA 138 06/10/2023   K 3.9 06/10/2023   CL 99 06/10/2023   CO2 31  06/10/2023   Lab Results  Component Value Date   ALT 30 06/10/2023   AST 24 06/10/2023   ALKPHOS 69 04/07/2022   BILITOT 0.7 06/10/2023   Lab Results  Component Value Date   HGBA1C 6.2 (H) 06/10/2023   HGBA1C 6.1 04/07/2022   HGBA1C 6.2 04/03/2021   HGBA1C 5.8 09/14/2019   HGBA1C 5.8 08/18/2018   Lab Results  Component Value Date   INSULIN 17.7 07/27/2023    Lab Results  Component Value Date   TSH 1.510 07/27/2023   Lab Results  Component Value Date   CHOL 270 (H) 06/10/2023   HDL 48 06/10/2023   LDLCALC 187 (H) 06/10/2023   TRIG 181 (H) 06/10/2023   CHOLHDL 5.6 (H) 06/10/2023   Lab Results  Component Value Date   VD25OH 26.5 (L) 07/27/2023   Lab Results  Component Value Date   WBC 7.4 10/11/2013   HGB 15.3 10/11/2013   HCT 45.0 10/11/2013   MCV 84.1 10/11/2013   PLT 284 10/11/2013   No results found for: "IRON", "TIBC", "FERRITIN"  Attestation Statements:   Reviewed by clinician on day of visit: allergies, medications, problem list, medical history, surgical history, family history, social history, and previous encounter notes.   Trude Mcburney, am acting as Energy manager for [employer], DO.  I have reviewed the above documentation for accuracy and completeness, and I agree with the above. Corinna Capra, DO

## 2023-09-06 ENCOUNTER — Ambulatory Visit: Payer: No Typology Code available for payment source | Admitting: Nurse Practitioner

## 2023-09-06 ENCOUNTER — Encounter: Payer: Self-pay | Admitting: Nurse Practitioner

## 2023-09-06 VITALS — BP 139/85 | HR 60 | Temp 98.2°F | Ht 67.0 in | Wt 236.0 lb

## 2023-09-06 DIAGNOSIS — E669 Obesity, unspecified: Secondary | ICD-10-CM

## 2023-09-06 DIAGNOSIS — Z6836 Body mass index (BMI) 36.0-36.9, adult: Secondary | ICD-10-CM | POA: Diagnosis not present

## 2023-09-06 DIAGNOSIS — E785 Hyperlipidemia, unspecified: Secondary | ICD-10-CM | POA: Diagnosis not present

## 2023-09-06 NOTE — Progress Notes (Signed)
Office: [phone]  /  Fax: [phone]  WEIGHT SUMMARY AND BIOMETRICS  Weight Lost Since Last Visit: 4lb  Weight Gained Since Last Visit: 0lb   Vitals Temp: 98.2 F (36.8 C) BP: 139/85 Pulse Rate: 60 SpO2: 96 %   Anthropometric Measurements Height: 5\' 7"  (1.702 m) Weight: 236 lb (107 kg) BMI (Calculated): 36.95 Weight at Last Visit: 240lb Weight Lost Since Last Visit: 4lb Weight Gained Since Last Visit: 0lb Starting Weight: 250lb Total Weight Loss (lbs): 14 lb (6.35 kg)   Body Composition  Body Fat %: 28.5 % Fat Mass (lbs): 67.4 lbs Muscle Mass (lbs): 160.6 lbs Total Body Water (lbs): 111.8 lbs Visceral Fat Rating : 16   Other Clinical Data Fasting: Yes Labs: No Today's Visit #: 3 Starting Date: 07/27/23     HPI  Chief Complaint: OBESITY  Rodney Beltran is here to discuss his progress with his obesity treatment plan. He is on the the Category 3 Plan and states he is following his eating plan approximately 90 % of the time. He states he is exercising 45-60 minutes 7 days per week.   Interval History:  Since last office visit he has lost 4 pounds.  He has done well with following the meal plan.  He finds it sometimes hard to meet his protein goals. He sometimes skips meals.  He is measuring and weighing his food. He is not struggling with polyphagia and cravings. He is drinking water daily.    Goal weight 180 lbs   Pharmacotherapy for weight loss: He is not currently taking medications  for medical weight loss.  Denies side effects.    Previous pharmacotherapy for medical weight loss: Has not taken medications in the past.  Bariatric surgery:  Patient has not had bariatric surgery.  Hyperlipidemia Medication(s): never been on meds.  FH:  mother  Lab Results  Component Value Date   CHOL 270 (H) 06/10/2023   HDL 48 06/10/2023   LDLCALC 187 (H) 06/10/2023   TRIG 181 (H) 06/10/2023   CHOLHDL 5.6 (H) 06/10/2023   Lab Results  Component Value Date    ALT 30 06/10/2023   AST 24 06/10/2023   ALKPHOS 69 04/07/2022   BILITOT 0.7 06/10/2023   The 10-year ASCVD risk score (Arnett DK, et al., 2019) is: 5.7%   Values used to calculate the score:     Age: 54 years     Sex: Male     Is Non-Hispanic African American: No     Diabetic: No     Tobacco smoker: No     Systolic Blood Pressure: 139 mmHg     Is BP treated: Yes     HDL Cholesterol: 48 mg/dL     Total Cholesterol: 270 mg/dL   PHYSICAL EXAM:  Blood pressure 139/85, pulse 60, temperature 98.2 F (36.8 C), height 5\' 7"  (1.702 m), weight 236 lb (107 kg), SpO2 96%. Body mass index is 36.96 kg/m.  General: He is overweight, cooperative, alert, well developed, and in no acute distress. PSYCH: Has normal mood, affect and thought process.   Extremities: No edema.  Neurologic: No gross sensory or motor deficits. No tremors or fasciculations noted.    DIAGNOSTIC DATA REVIEWED:  BMET    Component Value Date/Time   NA 138 06/10/2023 1613   K 3.9 06/10/2023 1613   CL 99 06/10/2023 1613   CO2 31 06/10/2023 1613   GLUCOSE 86 06/10/2023 1613   BUN 11 06/10/2023 1613   CREATININE 1.10 06/10/2023 1613  CALCIUM 10.5 (H) 06/10/2023 1613   Lab Results  Component Value Date   HGBA1C 6.2 (H) 06/10/2023   HGBA1C 5.9 03/16/2016   Lab Results  Component Value Date   INSULIN 17.7 07/27/2023   Lab Results  Component Value Date   TSH 1.510 07/27/2023   CBC    Component Value Date/Time   WBC 7.4 10/11/2013 1917   RBC 5.40 10/11/2013 1917   HGB 15.3 10/11/2013 1947   HCT 45.0 10/11/2013 1947   PLT 284 10/11/2013 1917   MCV 84.1 10/11/2013 1917   MCH 29.1 10/11/2013 1917   MCHC 34.6 10/11/2013 1917   RDW 13.2 10/11/2013 1917   Iron Studies No results found for: "IRON", "TIBC", "FERRITIN", "IRONPCTSAT" Lipid Panel     Component Value Date/Time   CHOL 270 (H) 06/10/2023 1613   TRIG 181 (H) 06/10/2023 1613   HDL 48 06/10/2023 1613   CHOLHDL 5.6 (H) 06/10/2023 1613   VLDL  36.0 04/07/2022 0812   LDLCALC 187 (H) 06/10/2023 1613   Hepatic Function Panel     Component Value Date/Time   PROT 7.5 06/10/2023 1613   ALBUMIN 4.5 04/07/2022 0812   AST 24 06/10/2023 1613   ALT 30 06/10/2023 1613   ALKPHOS 69 04/07/2022 0812   BILITOT 0.7 06/10/2023 1613      Component Value Date/Time   TSH 1.510 07/27/2023 0954   Nutritional Lab Results  Component Value Date   VD25OH 26.5 (L) 07/27/2023     ASSESSMENT AND PLAN  TREATMENT PLAN FOR OBESITY:  Recommended Dietary Goals  Haziel is currently in the action stage of change. As such, his goal is to continue weight management plan. He has agreed to the Category 3 Plan.  Behavioral Intervention  We discussed the following Behavioral Modification Strategies today: continue to work on maintaining a reduced calorie state, getting the recommended amount of protein, incorporating whole foods, making healthy choices, staying well hydrated and practicing mindfulness when eating..  Additional resources provided today: NA  Recommended Physical Activity Goals  Gavan has been advised to work up to 150 minutes of moderate intensity aerobic activity a week and strengthening exercises 2-3 times per week for cardiovascular health, weight loss maintenance and preservation of muscle mass.   He has agreed to Continue current level of physical activity    ASSOCIATED CONDITIONS ADDRESSED TODAY  Action/Plan  Hyperlipidemia, unspecified hyperlipidemia type Cardiovascular risk and specific lipid/LDL goals reviewed.  We discussed several lifestyle modifications today and Laterrance will continue to work on diet, exercise and weight loss efforts. Orders and follow up as documented in patient record.   Counseling Intensive lifestyle modifications are the first line treatment for this issue. Dietary changes: Increase soluble fiber. Decrease simple carbohydrates. Exercise changes: Moderate to vigorous-intensity aerobic activity 150  minutes per week if tolerated. Lipid-lowering medications: see documented in medical record.   Generalized obesity  BMI 36.0-36.9,adult         Return in about 3 weeks (around 09/27/2023).Marland Kitchen He was informed of the importance of frequent follow up visits to maximize his success with intensive lifestyle modifications for his multiple health conditions.   ATTESTASTION STATEMENTS:  Reviewed by clinician on day of visit: allergies, medications, problem list, medical history, surgical history, family history, social history, and previous encounter notes.   Time spent on visit including pre-visit chart review and post-visit care and charting was 30 minutes.    Theodis Sato. Yong Wahlquist FNP-C

## 2023-09-27 ENCOUNTER — Ambulatory Visit: Payer: No Typology Code available for payment source | Admitting: Nurse Practitioner

## 2023-09-27 ENCOUNTER — Encounter: Payer: Self-pay | Admitting: Nurse Practitioner

## 2023-09-27 VITALS — BP 135/86 | HR 64 | Temp 98.1°F | Ht 67.0 in | Wt 233.0 lb

## 2023-09-27 DIAGNOSIS — I10 Essential (primary) hypertension: Secondary | ICD-10-CM | POA: Diagnosis not present

## 2023-09-27 DIAGNOSIS — E669 Obesity, unspecified: Secondary | ICD-10-CM

## 2023-09-27 DIAGNOSIS — E559 Vitamin D deficiency, unspecified: Secondary | ICD-10-CM

## 2023-09-27 DIAGNOSIS — Z6836 Body mass index (BMI) 36.0-36.9, adult: Secondary | ICD-10-CM | POA: Diagnosis not present

## 2023-09-27 NOTE — Progress Notes (Signed)
Office: [phone]  /  Fax: [phone]  WEIGHT SUMMARY AND BIOMETRICS  Weight Lost Since Last Visit: 3lb  Weight Gained Since Last Visit: 0lb   Vitals Temp: 98.1 F (36.7 C) BP: 135/86 Pulse Rate: 64 SpO2: 98 %   Anthropometric Measurements Height: 5\' 7"  (1.702 m) Weight: 233 lb (105.7 kg) BMI (Calculated): 36.48 Weight at Last Visit: 236lb Weight Lost Since Last Visit: 3lb Weight Gained Since Last Visit: 0lb Starting Weight: 250lb Total Weight Loss (lbs): 17 lb (7.711 kg)   Body Composition  Body Fat %: 27.4 % Fat Mass (lbs): 63.8 lbs Muscle Mass (lbs): 161 lbs Total Body Water (lbs): 111.6 lbs Visceral Fat Rating : 15   Other Clinical Data Fasting: Yes Labs: No Today's Visit #: 4 Starting Date: 07/27/23     HPI  Chief Complaint: OBESITY  Rodney Beltran is here to discuss his progress with his obesity treatment plan. He is on the the Category 3 Plan and states he is following his eating plan approximately 75 % of the time. He states he is exercising 60 minutes 5 days per week.   Interval History:  Since last office visit he has lost 3 pounds.  He has been traveling a lot for work since his last visit. He has overall done well with weight loss.   He has increased his protein and water intake.    Goal weight 180 lbs     Pharmacotherapy for weight loss: He is not currently taking medications for medical weight loss.  Denies side effects.     Previous pharmacotherapy for medical weight loss: Has not taken medications in the past.   Bariatric surgery:  Patient has not had bariatric surgery.   Vit D deficiency  He is taking Vit D 50,000 IU weekly.  Denies side effects.  Denies nausea, vomiting or muscle weakness.    Lab Results  Component Value Date   VD25OH 26.5 (L) 07/27/2023    Hypertension Hypertension stable.  Medication(s): Lisinopril HCT. Denies side effects.   Denies chest pain, palpitations and SOB.  BP Readings from Last 3 Encounters:   09/27/23 135/86  09/06/23 139/85  08/15/23 (!) 145/88   Lab Results  Component Value Date   CREATININE 1.10 06/10/2023   CREATININE 1.21 04/07/2022   CREATININE 1.08 04/03/2021     PHYSICAL EXAM:  Blood pressure 135/86, pulse 64, temperature 98.1 F (36.7 C), height 5\' 7"  (1.702 m), weight 233 lb (105.7 kg), SpO2 98%. Body mass index is 36.49 kg/m.  General: He is overweight, cooperative, alert, well developed, and in no acute distress. PSYCH: Has normal mood, affect and thought process.   Extremities: No edema.  Neurologic: No gross sensory or motor deficits. No tremors or fasciculations noted.    DIAGNOSTIC DATA REVIEWED:  BMET    Component Value Date/Time   NA 138 06/10/2023 1613   K 3.9 06/10/2023 1613   CL 99 06/10/2023 1613   CO2 31 06/10/2023 1613   GLUCOSE 86 06/10/2023 1613   BUN 11 06/10/2023 1613   CREATININE 1.10 06/10/2023 1613   CALCIUM 10.5 (H) 06/10/2023 1613   Lab Results  Component Value Date   HGBA1C 6.2 (H) 06/10/2023   HGBA1C 5.9 03/16/2016   Lab Results  Component Value Date   INSULIN 17.7 07/27/2023   Lab Results  Component Value Date   TSH 1.510 07/27/2023   CBC    Component Value Date/Time   WBC 7.4 10/11/2013 1917   RBC 5.40 10/11/2013 1917  HGB 15.3 10/11/2013 1947   HCT 45.0 10/11/2013 1947   PLT 284 10/11/2013 1917   MCV 84.1 10/11/2013 1917   MCH 29.1 10/11/2013 1917   MCHC 34.6 10/11/2013 1917   RDW 13.2 10/11/2013 1917   Iron Studies No results found for: "IRON", "TIBC", "FERRITIN", "IRONPCTSAT" Lipid Panel     Component Value Date/Time   CHOL 270 (H) 06/10/2023 1613   TRIG 181 (H) 06/10/2023 1613   HDL 48 06/10/2023 1613   CHOLHDL 5.6 (H) 06/10/2023 1613   VLDL 36.0 04/07/2022 0812   LDLCALC 187 (H) 06/10/2023 1613   Hepatic Function Panel     Component Value Date/Time   PROT 7.5 06/10/2023 1613   ALBUMIN 4.5 04/07/2022 0812   AST 24 06/10/2023 1613   ALT 30 06/10/2023 1613   ALKPHOS 69 04/07/2022  0812   BILITOT 0.7 06/10/2023 1613      Component Value Date/Time   TSH 1.510 07/27/2023 0954   Nutritional Lab Results  Component Value Date   VD25OH 26.5 (L) 07/27/2023     ASSESSMENT AND PLAN  TREATMENT PLAN FOR OBESITY:  Recommended Dietary Goals  Rodney Beltran is currently in the action stage of change. As such, his goal is to continue weight management plan. He has agreed to the Category 3 Plan.  Behavioral Intervention  We discussed the following Behavioral Modification Strategies today: continue to work on maintaining a reduced calorie state, getting the recommended amount of protein, incorporating whole foods, making healthy choices, staying well hydrated and practicing mindfulness when eating..  Additional resources provided today: NA  Recommended Physical Activity Goals  Rodney Beltran has been advised to work up to 150 minutes of moderate intensity aerobic activity a week and strengthening exercises 2-3 times per week for cardiovascular health, weight loss maintenance and preservation of muscle mass.   He has agreed to Continue current level of physical activity     ASSOCIATED CONDITIONS ADDRESSED TODAY  Action/Plan  Vitamin D deficiency Continue Vit D as directed  Essential hypertension To reach out to PCP.  Needs refill.  Doing well  Generalized obesity  BMI 36.0-36.9,adult         Return in about 3 weeks (around 10/18/2023).Marland Kitchen He was informed of the importance of frequent follow up visits to maximize his success with intensive lifestyle modifications for his multiple health conditions.   ATTESTASTION STATEMENTS:  Reviewed by clinician on day of visit: allergies, medications, problem list, medical history, surgical history, family history, social history, and previous encounter notes.   Time spent on visit including pre-visit chart review and post-visit care and charting was 30 minutes.    Rodney Beltran. Yarely Bebee FNP-C

## 2023-10-03 ENCOUNTER — Other Ambulatory Visit: Payer: Self-pay | Admitting: Family Medicine

## 2023-10-03 DIAGNOSIS — I10 Essential (primary) hypertension: Secondary | ICD-10-CM

## 2023-10-03 DIAGNOSIS — N529 Male erectile dysfunction, unspecified: Secondary | ICD-10-CM

## 2023-10-03 MED ORDER — LISINOPRIL-HYDROCHLOROTHIAZIDE 20-12.5 MG PO TABS: 1.0000 | ORAL_TABLET | Freq: Every day | ORAL | 2 refills | Status: DC

## 2023-10-18 ENCOUNTER — Ambulatory Visit: Payer: No Typology Code available for payment source | Admitting: Nurse Practitioner

## 2023-10-18 ENCOUNTER — Encounter: Payer: Self-pay | Admitting: Nurse Practitioner

## 2023-10-18 VITALS — BP 130/83 | HR 62 | Temp 98.3°F | Ht 67.0 in | Wt 233.0 lb

## 2023-10-18 DIAGNOSIS — Z6836 Body mass index (BMI) 36.0-36.9, adult: Secondary | ICD-10-CM | POA: Diagnosis not present

## 2023-10-18 DIAGNOSIS — E669 Obesity, unspecified: Secondary | ICD-10-CM

## 2023-10-18 DIAGNOSIS — I10 Essential (primary) hypertension: Secondary | ICD-10-CM

## 2023-10-18 NOTE — Progress Notes (Signed)
Office: [phone]  /  Fax: [phone]  WEIGHT SUMMARY AND BIOMETRICS  Weight Lost Since Last Visit: 0  Weight Gained Since Last Visit: 0   Vitals Temp: 98.3 F (36.8 C) BP: 130/83 Pulse Rate: 62 SpO2: 97 %   Anthropometric Measurements Height: 5\' 7"  (1.702 m) Weight: 233 lb (105.7 kg) BMI (Calculated): 36.48 Weight at Last Visit: 233 lb Weight Lost Since Last Visit: 0 Weight Gained Since Last Visit: 0 Starting Weight: 250 lb Total Weight Loss (lbs): 17 lb (7.711 kg)   Body Composition  Body Fat %: 28.7 % Fat Mass (lbs): 67 lbs Muscle Mass (lbs): 158.4 lbs Total Body Water (lbs): 111 lbs Visceral Fat Rating : 16   Other Clinical Data Fasting: yes Labs: no Today's Visit #: 5 Starting Date: 07/27/23     HPI  Chief Complaint: OBESITY  Rodney Beltran is here to discuss his progress with his obesity treatment plan. He is on the the Category 3 Plan and states he is following his eating plan approximately 10 % of the time. He states he is not exercising.   Interval History:  Since last office visit he has maintained his weight.  Since Thanksgiving he has been traveling more for work and family and hasn't been able to follow the meal plan as closely he would like. He has been skipping a few meals.   He is traveling this week and then will have 3 weeks off.  His 4th quarter is "hard" at work.  He's exhausted mentally and physically,.  He hasn't been able to meal plan.  He is just now "trying to get by" until after this week.  He is drinking water.  Denies alcohol intake since before starting the program. He has been avoiding desserts. Denies polyphagia, some cravings.  He hasn't been able to work out as he was due to traveling.   Pharmacotherapy for weight loss: He is not currently taking medications for medical weight loss.  Denies side effects.     Previous pharmacotherapy for medical weight loss: Has not taken medications in the past.   Bariatric surgery:  Patient  has not had bariatric surgery.    Hypertension Hypertension stable.  Medication(s): Lisinopril HCT 20-12.5mg .   Denies chest pain, palpitations and SOB.  BP Readings from Last 3 Encounters:  10/18/23 130/83  09/27/23 135/86  09/06/23 139/85   Lab Results  Component Value Date   CREATININE 1.10 06/10/2023   CREATININE 1.21 04/07/2022   CREATININE 1.08 04/03/2021     PHYSICAL EXAM:  Blood pressure 130/83, pulse 62, temperature 98.3 F (36.8 C), height 5\' 7"  (1.702 m), weight 233 lb (105.7 kg), SpO2 97%. Body mass index is 36.49 kg/m.  General: He is overweight, cooperative, alert, well developed, and in no acute distress. PSYCH: Has normal mood, affect and thought process.   Extremities: No edema.  Neurologic: No gross sensory or motor deficits. No tremors or fasciculations noted.    DIAGNOSTIC DATA REVIEWED:  BMET    Component Value Date/Time   NA 138 06/10/2023 1613   K 3.9 06/10/2023 1613   CL 99 06/10/2023 1613   CO2 31 06/10/2023 1613   GLUCOSE 86 06/10/2023 1613   BUN 11 06/10/2023 1613   CREATININE 1.10 06/10/2023 1613   CALCIUM 10.5 (H) 06/10/2023 1613   Lab Results  Component Value Date   HGBA1C 6.2 (H) 06/10/2023   HGBA1C 5.9 03/16/2016   Lab Results  Component Value Date   INSULIN 17.7 07/27/2023   Lab  Results  Component Value Date   TSH 1.510 07/27/2023   CBC    Component Value Date/Time   WBC 7.4 10/11/2013 1917   RBC 5.40 10/11/2013 1917   HGB 15.3 10/11/2013 1947   HCT 45.0 10/11/2013 1947   PLT 284 10/11/2013 1917   MCV 84.1 10/11/2013 1917   MCH 29.1 10/11/2013 1917   MCHC 34.6 10/11/2013 1917   RDW 13.2 10/11/2013 1917   Iron Studies No results found for: "IRON", "TIBC", "FERRITIN", "IRONPCTSAT" Lipid Panel     Component Value Date/Time   CHOL 270 (H) 06/10/2023 1613   TRIG 181 (H) 06/10/2023 1613   HDL 48 06/10/2023 1613   CHOLHDL 5.6 (H) 06/10/2023 1613   VLDL 36.0 04/07/2022 0812   LDLCALC 187 (H) 06/10/2023 1613    Hepatic Function Panel     Component Value Date/Time   PROT 7.5 06/10/2023 1613   ALBUMIN 4.5 04/07/2022 0812   AST 24 06/10/2023 1613   ALT 30 06/10/2023 1613   ALKPHOS 69 04/07/2022 0812   BILITOT 0.7 06/10/2023 1613      Component Value Date/Time   TSH 1.510 07/27/2023 0954   Nutritional Lab Results  Component Value Date   VD25OH 26.5 (L) 07/27/2023     ASSESSMENT AND PLAN  TREATMENT PLAN FOR OBESITY:  Recommended Dietary Goals  Rodney Beltran is currently in the action stage of change. As such, his goal is to continue weight management plan. He has agreed to the Category 3 Plan.  Behavioral Intervention  We discussed the following Behavioral Modification Strategies today: increasing lean protein intake to established goals, avoiding skipping meals, increasing water intake , planning for success, celebration eating strategies, and continue to work on maintaining a reduced calorie state, getting the recommended amount of protein, incorporating whole foods, making healthy choices, staying well hydrated and practicing mindfulness when eating..  Additional resources provided today: NA  Recommended Physical Activity Goals  Rodney Beltran has been advised to work up to 150 minutes of moderate intensity aerobic activity a week and strengthening exercises 2-3 times per week for cardiovascular health, weight loss maintenance and preservation of muscle mass.   He has agreed to Think about enjoyable ways to increase daily physical activity and overcoming barriers to exercise, Increase physical activity in their day and reduce sedentary time (increase NEAT)., and Work on scheduling and tracking physical activity.   ASSOCIATED CONDITIONS ADDRESSED TODAY  Action/Plan  Essential hypertension Continue follow-up with PCP.  Continue medications as directed.  Rodney Beltran is working on healthy weight loss and exercise to improve blood pressure control. We will watch for signs of hypotension as he  continues his lifestyle modifications.   Generalized obesity  BMI 36.0-36.9,adult         Return in about 4 weeks (around 11/15/2023).Marland Kitchen He was informed of the importance of frequent follow up visits to maximize his success with intensive lifestyle modifications for his multiple health conditions.   ATTESTASTION STATEMENTS:  Reviewed by clinician on day of visit: allergies, medications, problem list, medical history, surgical history, family history, social history, and previous encounter notes.   Time spent on visit including pre-visit chart review and post-visit care and charting was 30 minutes.    Rodney Beltran. Laurice Kimmons FNP-C

## 2023-11-15 ENCOUNTER — Ambulatory Visit: Payer: No Typology Code available for payment source | Admitting: Nurse Practitioner

## 2024-09-11 ENCOUNTER — Ambulatory Visit: Admitting: Family Medicine

## 2024-09-11 ENCOUNTER — Encounter: Payer: Self-pay | Admitting: Family Medicine

## 2024-09-11 VITALS — BP 138/86 | HR 72 | Temp 97.8°F | Resp 16 | Ht 67.0 in | Wt 252.0 lb

## 2024-09-11 DIAGNOSIS — L723 Sebaceous cyst: Secondary | ICD-10-CM

## 2024-09-11 DIAGNOSIS — Z1211 Encounter for screening for malignant neoplasm of colon: Secondary | ICD-10-CM | POA: Diagnosis not present

## 2024-09-11 DIAGNOSIS — G5603 Carpal tunnel syndrome, bilateral upper limbs: Secondary | ICD-10-CM

## 2024-09-11 DIAGNOSIS — Z0000 Encounter for general adult medical examination without abnormal findings: Secondary | ICD-10-CM | POA: Diagnosis not present

## 2024-09-11 DIAGNOSIS — E559 Vitamin D deficiency, unspecified: Secondary | ICD-10-CM | POA: Diagnosis not present

## 2024-09-11 DIAGNOSIS — E785 Hyperlipidemia, unspecified: Secondary | ICD-10-CM | POA: Diagnosis not present

## 2024-09-11 DIAGNOSIS — Z1329 Encounter for screening for other suspected endocrine disorder: Secondary | ICD-10-CM | POA: Diagnosis not present

## 2024-09-11 DIAGNOSIS — Z130 Encounter for screening for diseases of the blood and blood-forming organs and certain disorders involving the immune mechanism: Secondary | ICD-10-CM

## 2024-09-11 DIAGNOSIS — R200 Anesthesia of skin: Secondary | ICD-10-CM | POA: Diagnosis not present

## 2024-09-11 DIAGNOSIS — Z13228 Encounter for screening for other metabolic disorders: Secondary | ICD-10-CM

## 2024-09-11 DIAGNOSIS — I10 Essential (primary) hypertension: Secondary | ICD-10-CM

## 2024-09-11 LAB — CBC
HCT: 46.4 % (ref 39.0–52.0)
Hemoglobin: 15.7 g/dL (ref 13.0–17.0)
MCHC: 33.7 g/dL (ref 30.0–36.0)
MCV: 85.6 fl (ref 78.0–100.0)
Platelets: 242 K/uL (ref 150.0–400.0)
RBC: 5.42 Mil/uL (ref 4.22–5.81)
RDW: 13.6 % (ref 11.5–15.5)
WBC: 5.1 K/uL (ref 4.0–10.5)

## 2024-09-11 LAB — HEMOGLOBIN A1C: Hgb A1c MFr Bld: 6.3 % (ref 4.6–6.5)

## 2024-09-11 LAB — COMPREHENSIVE METABOLIC PANEL WITH GFR
ALT: 43 U/L (ref 0–53)
AST: 31 U/L (ref 0–37)
Albumin: 4.6 g/dL (ref 3.5–5.2)
Alkaline Phosphatase: 63 U/L (ref 39–117)
BUN: 12 mg/dL (ref 6–23)
CO2: 26 meq/L (ref 19–32)
Calcium: 9 mg/dL (ref 8.4–10.5)
Chloride: 104 meq/L (ref 96–112)
Creatinine, Ser: 0.98 mg/dL (ref 0.40–1.50)
GFR: 91.26 mL/min (ref >60.00)
Glucose, Bld: 102 mg/dL — ABNORMAL HIGH (ref 70–99)
Potassium: 3.6 meq/L (ref 3.5–5.1)
Sodium: 138 meq/L (ref 135–145)
Total Bilirubin: 0.5 mg/dL (ref 0.2–1.2)
Total Protein: 7.6 g/dL (ref 6.0–8.3)

## 2024-09-11 LAB — LIPID PANEL
Cholesterol: 245 mg/dL — ABNORMAL HIGH (ref 0–200)
HDL: 46.5 mg/dL (ref >39.00)
LDL Cholesterol: 175 mg/dL — ABNORMAL HIGH (ref 0–99)
NonHDL: 198.38
Total CHOL/HDL Ratio: 5
Triglycerides: 118 mg/dL (ref 0.0–149.0)
VLDL: 23.6 mg/dL (ref 0.0–40.0)

## 2024-09-11 MED ORDER — LISINOPRIL-HYDROCHLOROTHIAZIDE 20-12.5 MG PO TABS
1.0000 | ORAL_TABLET | Freq: Every day | ORAL | 3 refills | Status: AC
Start: 2024-09-11 — End: ?

## 2024-09-11 NOTE — Assessment & Plan Note (Signed)
We discussed the importance of regular physical activity and healthy diet for prevention of chronic illness and/or complications. Preventive guidelines reviewed. Vaccination up to date. Next CPE in a year. 

## 2024-09-11 NOTE — Progress Notes (Signed)
 "  Chief Complaint  Patient presents with   Fatigue   Numbness   Annual Exam   Discussed the use of AI scribe software for clinical note transcription with the patient, who gave verbal consent to proceed.  History of Present Illness Rodney Beltran is a 48 year old male with past medical history significant for GERD, hyperlipidemia, and hypertension who is here today for CPE, follow up, and hand pain and numbness. Last follow up and CPE on 06/10/23.  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  He has stopped consuming alcohol and does not smoke or use drugs.  He acknowledges challenges with maintaining a healthy diet, particularly when traveling for work, 3-4 times per month.  HLD on non pharmacologic treatment. Lab Results  Component Value Date   CHOL 270 (H) 06/10/2023   HDL 48 06/10/2023   LDLCALC 187 (H) 06/10/2023   TRIG 181 (H) 06/10/2023   CHOLHDL 5.6 (H) 06/10/2023   Elevated HgA1C, no hx of diabetes. Lab Results  Component Value Date   HGBA1C 6.2 (H) 06/10/2023   HTN: Currently on Lisinopril -HCTZ 20-12.5 mg daily.  BP elevated today. He reports that his blood pressure readings are typically around 120/80 mmHg when he takes his medication. However, he forgot his medication over a weekend trip to [place] , resulting in elevated blood pressure readings.  Lab Results  Component Value Date   NA 138 06/10/2023   CL 99 06/10/2023   K 3.9 06/10/2023   CO2 31 06/10/2023   BUN 11 06/10/2023   CREATININE 1.10 06/10/2023   GFR 72.08 04/07/2022   CALCIUM 10.5 (H) 06/10/2023   ALBUMIN 4.5 04/07/2022   GLUCOSE 86 06/10/2023   Lab Results  Component Value Date   ALT 30 06/10/2023   AST 24 06/10/2023   ALKPHOS 69 04/07/2022   BILITOT 0.7 06/10/2023   Vit D deficiency: He was on Ergocalciferol  50,000 U weekly. Lab Results  Component Value Date   VD25OH 26.5 (L)  07/27/2023   -He has been experiencing hand pain and numbness for the past three to four months. The sensation is described as his hands 'falling asleep' during the night, which disrupts his sleep, causing him to wake up four to five times per night. The numbness affects both hands, alleviated by shaking hands. Right-handed. Recently, the hand pain has started to occur during the day as well.  He plays tennis occasionally and experienced significant pain in his hand about a month ago, affecting his ability to play.   -He is also c/o a long-standing issue with a lesion he describes as an 'ingrown hair' that has been present for nearly ten years. The lesion has grown larger and changed color to purple over time. Currently is it not tender.  Review of Systems  Constitutional:  Positive for fatigue. Negative for activity change, appetite change, fever and unexpected weight change.  HENT:  Negative for mouth sores, sore throat, trouble swallowing and voice change.   Eyes:  Negative for redness and visual disturbance.  Respiratory:  Negative for apnea, cough, shortness of breath and wheezing.   Cardiovascular:  Negative for chest pain, palpitations and leg swelling.  Gastrointestinal:  Negative for abdominal pain, blood in stool, nausea and vomiting.  Endocrine: Negative for cold intolerance, heat intolerance, polydipsia, polyphagia and polyuria.  Genitourinary:  Negative for decreased urine volume, dysuria, genital sores, hematuria and testicular  pain.  Musculoskeletal:  Negative for gait problem and myalgias.  Skin:  Negative for color change and rash.  Allergic/Immunologic: Negative for environmental allergies.  Neurological:  Negative for syncope, weakness and headaches.  Hematological:  Negative for adenopathy. Does not bruise/bleed easily.  Psychiatric/Behavioral:  Negative for confusion. The patient is not nervous/anxious.    Current Outpatient Medications on File Prior to Visit   Medication Sig Dispense Refill   lisinopril -hydrochlorothiazide  (ZESTORETIC ) 20-12.5 MG tablet Take 1 tablet by mouth daily. 90 tablet 2   MAGNESIUM CITRATE PO Take by mouth.     sildenafil  (VIAGRA ) 100 MG tablet take 1 tablet by mouth daily as needed for erectile dysfunction 30 tablet 5   Vitamin D , Ergocalciferol , (DRISDOL ) 1.25 MG (50000 UNIT) CAPS capsule Take 1 capsule (50,000 Units total) by mouth every 7 (seven) days. 12 capsule 0   No current facility-administered medications on file prior to visit.   Past Medical History:  Diagnosis Date   Back pain    Constipation    GERD (gastroesophageal reflux disease)    Hyperlipidemia    Hypertension    No Known Allergies  Social History   Socioeconomic History   Marital status: Married    Spouse name: Not on file   Number of children: Not on file   Years of education: Not on file   Highest education level: Bachelor's degree (e.g., BA, AB, BS)  Occupational History   Not on file  Tobacco Use   Smoking status: Never   Smokeless tobacco: Never  Vaping Use   Vaping status: Never Used  Substance and Sexual Activity   Alcohol use: No   Drug use: No   Sexual activity: Yes  Other Topics Concern   Not on file  Social History Narrative   Not on file   Social Drivers of Health   Financial Resource Strain: Low Risk  (09/07/2024)   Overall Financial Resource Strain (CARDIA)    Difficulty of Paying Living Expenses: Not hard at all  Food Insecurity: No Food Insecurity (09/07/2024)   Hunger Vital Sign    Worried About Running Out of Food in the Last Year: Never true    Ran Out of Food in the Last Year: Never true  Transportation Needs: No Transportation Needs (09/07/2024)   PRAPARE - Administrator, Civil Service (Medical): No    Lack of Transportation (Non-Medical): No  Physical Activity: Insufficiently Active (09/07/2024)   Exercise Vital Sign    Days of Exercise per Week: 3 days    Minutes of Exercise per  Session: 30 min  Stress: Stress Concern Present (09/07/2024)   [employer] of Occupational Health - Occupational Stress Questionnaire    Feeling of Stress: Very much  Social Connections: Moderately Integrated (09/07/2024)   Social Connection and Isolation Panel    Frequency of Communication with Friends and Family: Once a week    Frequency of Social Gatherings with Friends and Family: More than three times a week    Attends Religious Services: More than 4 times per year    Active Member of Clubs or Organizations: No    Attends Banker Meetings: Not on file    Marital Status: Married    Vitals:   09/11/24 0734  BP: 138/86  Pulse: 72  Resp: 16  Temp: 97.8 F (36.6 C)  SpO2: 97%   Wt Readings from Last 3 Encounters:  09/11/24 252 lb (114.3 kg)  10/18/23 233 lb (105.7 kg)  09/27/23 233 lb (  105.7 kg)   Body mass index is 39.47 kg/m.  Physical Exam Vitals and nursing note reviewed.  Constitutional:      General: He is not in acute distress.    Appearance: He is well-developed.  HENT:     Head: Normocephalic and atraumatic.     Right Ear: Tympanic membrane, ear canal and external ear normal.     Left Ear: Tympanic membrane, ear canal and external ear normal.     Mouth/Throat:     Mouth: Mucous membranes are moist.     Pharynx: Oropharynx is clear.  Eyes:     Extraocular Movements: Extraocular movements intact.     Conjunctiva/sclera: Conjunctivae normal.     Pupils: Pupils are equal, round, and reactive to light.  Neck:     Thyroid : No thyroid  mass or thyromegaly.  Cardiovascular:     Rate and Rhythm: Normal rate and regular rhythm.     Pulses:          Dorsalis pedis pulses are 2+ on the right side and 2+ on the left side.     Heart sounds: No murmur heard. Pulmonary:     Effort: Pulmonary effort is normal. No respiratory distress.     Breath sounds: Normal breath sounds.  Abdominal:     Palpations: Abdomen is soft. There is no hepatomegaly or  mass.     Tenderness: There is no abdominal tenderness.  Genitourinary:    Comments: No concerns. Musculoskeletal:        General: No tenderness.     Cervical back: Normal range of motion.     Comments: No major deformities appreciated and no signs of synovitis. Left tinel positive. Phalen negative bilateral.  Lymphadenopathy:     Cervical: No cervical adenopathy.     Upper Body:     Right upper body: No supraclavicular adenopathy.     Left upper body: No supraclavicular adenopathy.  Skin:    General: Skin is warm.     Findings: No erythema.      Neurological:     General: No focal deficit present.     Mental Status: He is alert and oriented to person, place, and time.     Cranial Nerves: No cranial nerve deficit.     Gait: Gait normal.     Deep Tendon Reflexes:     Reflex Scores:      Bicep reflexes are 2+ on the right side and 2+ on the left side.      Patellar reflexes are 2+ on the right side and 2+ on the left side. Psychiatric:        Mood and Affect: Mood and affect normal.    ASSESSMENT AND PLAN:  Mr Ehle was seen today for CPE, follow up and hand numbness. Orders Placed This Encounter  Procedures   Comprehensive metabolic panel with GFR   Hemoglobin A1c   Lipid panel   VITAMIN D  25 Hydroxy (Vit-D Deficiency, Fractures)   CBC   TSH   Vitamin B12   Ambulatory referral to Gastroenterology   Ambulatory referral to General Surgery   Ambulatory referral to Sports Medicine   Lab Results  Component Value Date   HGBA1C 6.3 09/11/2024   Lab Results  Component Value Date   NA 138 09/11/2024   CL 104 09/11/2024   K 3.6 09/11/2024   CO2 26 09/11/2024   BUN 12 09/11/2024   CREATININE 0.98 09/11/2024   GFR 91.26 09/11/2024   CALCIUM 9.0 09/11/2024  ALBUMIN 4.6 09/11/2024   GLUCOSE 102 (H) 09/11/2024   Lab Results  Component Value Date   ALT 43 09/11/2024   AST 31 09/11/2024   ALKPHOS 63 09/11/2024   BILITOT 0.5 09/11/2024   Lab Results   Component Value Date   VD25OH 20.71 (L) 09/11/2024   Lab Results  Component Value Date   WBC 5.1 09/11/2024   HGB 15.7 09/11/2024   HCT 46.4 09/11/2024   MCV 85.6 09/11/2024   PLT 242.0 09/11/2024   Lab Results  Component Value Date   TSH 1.22 09/11/2024   Lab Results  Component Value Date   VITAMINB12 225 09/11/2024   Routine general medical examination at a health care facility Assessment & Plan: We discussed the importance of regular physical activity and healthy diet for prevention of chronic illness and/or complications. Preventive guidelines reviewed. Vaccination up to date. Next CPE in a year.  Sebaceous cyst We discussed differential Dx's, most likely benign. Slow growth through the years, he would like it removed. Referral to surgeon placed.  -     Ambulatory referral to General Surgery  Bilateral carpal tunnel syndrome Treatment options discussed. Nigh time splints recommended. He would like referral to Sport medicine to discuss injectable treatment options.  -     Ambulatory referral to Sports Medicine  Bilateral hand numbness Possible causes discussed. Hx is suggestive of carpal tunnel synd. No hx of tick bites or Fhx of amyloidosis. Further recommendations according to lab results.  -     CBC; Future -     TSH; Future -     Vitamin B12; Future  Hyperlipidemia, unspecified hyperlipidemia type Assessment & Plan: Last LDL 187 in 09/2023. Non pharmacologic treatment recommended for now. Further recommendations will be given according to 10 years CVD risk score and lipid panel numbers.  Orders: -     Comprehensive metabolic panel with GFR; Future -     Lipid panel; Future  Essential hypertension, benign Assessment & Plan: BP mildly elevated today, attributed to not taken med for the past few days consistently. We discussed possible complications of elevated BP. Continue Lisinopril -hydrochlorothiazide  20-12.5 mg daily and low salt  diet. Continue monitoring BP regularly. He is going [facility] with [facility], so can follow here in a year.  Orders: -     Comprehensive metabolic panel with GFR; Future -     Lisinopril -hydroCHLOROthiazide ; Take 1 tablet by mouth daily.  Dispense: 90 tablet; Refill: 3  Vitamin D  deficiency, unspecified Assessment & Plan: Currently not on vit D supplementation. Further recommendations according to 25 OH vit D result.  Orders: -     VITAMIN D  25 Hydroxy (Vit-D Deficiency, Fractures); Future  Colon cancer screening -     Ambulatory referral to Gastroenterology  Screening for endocrine, metabolic and immunity disorder -     Hemoglobin A1c; Future   Return in 1 year (on 09/11/2025) for CPE, chronic problems.  Letesha Klecker G. Hydia Copelin, MD  [facility] [place] office.  "

## 2024-09-11 NOTE — Assessment & Plan Note (Signed)
 BP mildly elevated today, attributed to not taken med for the past few days consistently. We discussed possible complications of elevated BP. Continue Lisinopril -hydrochlorothiazide  20-12.5 mg daily and low salt diet. Continue monitoring BP regularly. He is going to [facility]establish with [facility] months, so can follow here in a year.

## 2024-09-11 NOTE — Patient Instructions (Addendum)
 A few things to remember from today's visit:  Routine general medical examination at a health care facility  Sebaceous cyst - Plan: Ambulatory referral to General Surgery  Bilateral carpal tunnel syndrome - Plan: Ambulatory referral to Sports Medicine  Colon cancer screening - Plan: Ambulatory referral to Gastroenterology  Screening for endocrine, metabolic and immunity disorder - Plan: Hemoglobin A1c  Bilateral hand numbness - Plan: CBC, TSH, Vitamin B12  Vitamin D  deficiency - Plan: VITAMIN D  25 Hydroxy (Vit-D Deficiency, Fractures)  Hyperlipidemia, unspecified hyperlipidemia type - Plan: Comprehensive metabolic panel with GFR, Lipid panel  Essential hypertension, benign - Plan: Comprehensive metabolic panel with GFR  No changes today. If you continue with [facility] I can see you annually.  If you need refills for medications you take chronically, please call your pharmacy. Do not use My Chart to request refills or for acute issues that need immediate attention. If you send a my chart message, it may take a few days to be addressed, specially if I am not in the office.  Please be sure medication list is accurate. If a new problem present, please set up appointment sooner than planned today.  Health Maintenance, Male Adopting a healthy lifestyle and getting preventive care are important in promoting health and wellness. Ask your health care provider about: The right schedule for you to have regular tests and exams. Things you can do on your own to prevent diseases and keep yourself healthy. What should I know about diet, weight, and exercise? Eat a healthy diet  Eat a diet that includes plenty of vegetables, fruits, low-fat dairy products, and lean protein. Do not eat a lot of foods that are high in solid fats, added sugars, or sodium. Maintain a healthy weight Body mass index (BMI) is a measurement that can be used to identify possible weight problems.  It estimates body fat based on height and weight. Your health care provider can help determine your BMI and help you achieve or maintain a healthy weight. Get regular exercise Get regular exercise. This is one of the most important things you can do for your health. Most adults should: Exercise for at least 150 minutes each week. The exercise should increase your heart rate and make you sweat (moderate-intensity exercise). Do strengthening exercises at least twice a week. This is in addition to the moderate-intensity exercise. Spend less time sitting. Even light physical activity can be beneficial. Watch cholesterol and blood lipids Have your blood tested for lipids and cholesterol at 48 years of age, then have this test every 5 years. You may need to have your cholesterol levels checked more often if: Your lipid or cholesterol levels are high. You are older than 48 years of age. You are at high risk for heart disease. What should I know about cancer screening? Many types of cancers can be detected early and may often be prevented. Depending on your health history and family history, you may need to have cancer screening at various ages. This may include screening for: Colorectal cancer. Prostate cancer. Skin cancer. Lung cancer. What should I know about heart disease, diabetes, and high blood pressure? Blood pressure and heart disease High blood pressure causes heart disease and increases the risk of stroke. This is more likely to develop in people who have high blood pressure readings or are overweight. Talk with your health care provider about your target blood pressure readings. Have your blood pressure checked: Every 3-5 years if you are 18-39 years  of age. Every year if you are 45 years old or older. If you are between the ages of 23 and 6 and are a current or former smoker, ask your health care provider if you should have a one-time screening for abdominal aortic aneurysm  (AAA). Diabetes Have regular diabetes screenings. This checks your fasting blood sugar level. Have the screening done: Once every three years after age 71 if you are at a normal weight and have a low risk for diabetes. More often and at a younger age if you are overweight or have a high risk for diabetes. What should I know about preventing infection? Hepatitis B If you have a higher risk for hepatitis B, you should be screened for this virus. Talk with your health care provider to find out if you are at risk for hepatitis B infection. Hepatitis C Blood testing is recommended for: Everyone born from [birth year] through 1965. Anyone with known risk factors for hepatitis C. Sexually transmitted infections (STIs) You should be screened each year for STIs, including gonorrhea and chlamydia, if: You are sexually active and are younger than 48 years of age. You are older than 48 years of age and your health care provider tells you that you are at risk for this type of infection. Your sexual activity has changed since you were last screened, and you are at increased risk for chlamydia or gonorrhea. Ask your health care provider if you are at risk. Ask your health care provider about whether you are at high risk for HIV. Your health care provider may recommend a prescription medicine to help prevent HIV infection. If you choose to take medicine to prevent HIV, you should first get tested for HIV. You should then be tested every 3 months for as long as you are taking the medicine. Follow these instructions at home: Alcohol use Do not drink alcohol if your health care provider tells you not to drink. If you drink alcohol: Limit how much you have to 0-2 drinks a day. Know how much alcohol is in your drink. In the [place] one drink equals one 12 oz bottle of beer (355 mL), one 5 oz glass of wine (148 mL), or one 1 oz glass of hard liquor (44 mL). Lifestyle Do not use any products that contain nicotine or  tobacco. These products include cigarettes, chewing tobacco, and vaping devices, such as e-cigarettes. If you need help quitting, ask your health care provider. Do not use street drugs. Do not share needles. Ask your health care provider for help if you need support or information about quitting drugs. General instructions Schedule regular health, dental, and eye exams. Stay current with your vaccines. Tell your health care provider if: You often feel depressed. You have ever been abused or do not feel safe at home. Summary Adopting a healthy lifestyle and getting preventive care are important in promoting health and wellness. Follow your health care provider's instructions about healthy diet, exercising, and getting tested or screened for diseases. Follow your health care provider's instructions on monitoring your cholesterol and blood pressure. This information is not intended to replace advice given to you by your health care provider. Make sure you discuss any questions you have with your health care provider. Document Revised: 03/16/2021 Document Reviewed: 03/16/2021 Elsevier Patient Education  2024 [employer].

## 2024-09-11 NOTE — Assessment & Plan Note (Addendum)
 Last LDL 187 in 09/2023. Non pharmacologic treatment recommended for now. Further recommendations will be given according to 10 years CVD risk score and lipid panel numbers.

## 2024-09-11 NOTE — Assessment & Plan Note (Signed)
 Currently not on vit D supplementation. Further recommendations according to 25 OH vit D result.

## 2024-09-12 ENCOUNTER — Encounter: Admitting: Family Medicine

## 2024-09-12 ENCOUNTER — Ambulatory Visit: Payer: Self-pay | Admitting: Family Medicine

## 2024-09-12 LAB — TSH: TSH: 1.22 u[IU]/mL (ref 0.35–5.50)

## 2024-09-12 LAB — VITAMIN B12: Vitamin B-12: 225 pg/mL (ref 211–911)

## 2024-09-12 LAB — VITAMIN D 25 HYDROXY (VIT D DEFICIENCY, FRACTURES): VITD: 20.71 ng/mL — ABNORMAL LOW (ref 30.00–100.00)

## 2024-09-18 ENCOUNTER — Other Ambulatory Visit: Payer: Self-pay

## 2024-09-18 ENCOUNTER — Ambulatory Visit (INDEPENDENT_AMBULATORY_CARE_PROVIDER_SITE_OTHER)

## 2024-09-18 VITALS — BP 138/98 | Ht 67.0 in | Wt 250.0 lb

## 2024-09-18 DIAGNOSIS — M25531 Pain in right wrist: Secondary | ICD-10-CM

## 2024-09-18 DIAGNOSIS — M25532 Pain in left wrist: Secondary | ICD-10-CM

## 2024-09-18 DIAGNOSIS — G5601 Carpal tunnel syndrome, right upper limb: Secondary | ICD-10-CM | POA: Diagnosis not present

## 2024-09-18 DIAGNOSIS — G5602 Carpal tunnel syndrome, left upper limb: Secondary | ICD-10-CM

## 2024-09-18 NOTE — Progress Notes (Signed)
   Subjective:    Patient ID: Rodney Beltran, male    DOB: 47 y.o., [birth year]   MRN: [id number]  Chief Complaint: Bilateral wrist pain.  Discussed the use of AI scribe software for clinical note transcription with the patient, who gave verbal consent to proceed.  History of Present Illness Rodney Beltran is a 48 year old male who presents with bilateral hand numbness and tingling at night.  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 20-30 mins tingling at night. Never happening during the day. No tx's tried.      Objective:   Vitals:   09/18/24 1056  BP: (!) 138/98   Left wrist: Positive Tinel's.  Equivocal Durkan's.  Equivocal Phalen's.  Right wrist: Positive Tinel's..  Negative Durkan's.  Negative Phalen's.  Limited ultrasound evaluation of the bilateral wrists: Left median nerve cross-sectional area 11 mm Right median nerve cross-sectional area 10  mm.  Bifid median nerve morphology on the right. Impression: Bilateral mild carpal tunnel syndrome    Assessment & Plan:   Assessment & Plan Bilateral carpal tunnel syndrome   He has experienced nocturnal numbness and tingling in both hands for 3-4 months, with daytime soreness. Ultrasound reveals median nerve enlargement (11 mm on left, 10 mm on right) and a bifid median nerve on the right, indicating mild carpal tunnel syndrome. Symptoms are likely due to median nerve compression at the wrist, worsened by weight gain and lifestyle factors. Cervical radiculopathy is considered but less likely due to bilateral presentation and absence of forearm symptoms. Fitted for wrist cock-up splints for nighttime use. Use splints for 4 weeks, then discontinue for 2 weeks to assess symptom recurrence. Follow-up visit scheduled in 6 weeks to evaluate treatment efficacy. Advised to contact if no improvement in 3 weeks or if symptoms worsen.

## 2024-09-24 ENCOUNTER — Other Ambulatory Visit: Payer: Self-pay | Admitting: Nurse Practitioner

## 2024-09-24 ENCOUNTER — Encounter: Payer: Self-pay | Admitting: Nurse Practitioner

## 2024-09-24 ENCOUNTER — Telehealth: Payer: Self-pay

## 2024-09-24 ENCOUNTER — Ambulatory Visit: Admitting: Nurse Practitioner

## 2024-09-24 VITALS — BP 134/81 | HR 77 | Temp 99.2°F | Ht 67.0 in | Wt 248.0 lb

## 2024-09-24 DIAGNOSIS — R400 Somnolence: Secondary | ICD-10-CM

## 2024-09-24 DIAGNOSIS — E66812 Obesity, class 2: Secondary | ICD-10-CM

## 2024-09-24 DIAGNOSIS — R7303 Prediabetes: Secondary | ICD-10-CM | POA: Diagnosis not present

## 2024-09-24 DIAGNOSIS — E785 Hyperlipidemia, unspecified: Secondary | ICD-10-CM

## 2024-09-24 DIAGNOSIS — I10 Essential (primary) hypertension: Secondary | ICD-10-CM | POA: Diagnosis not present

## 2024-09-24 DIAGNOSIS — E559 Vitamin D deficiency, unspecified: Secondary | ICD-10-CM

## 2024-09-24 DIAGNOSIS — E538 Deficiency of other specified B group vitamins: Secondary | ICD-10-CM

## 2024-09-24 DIAGNOSIS — Z6838 Body mass index (BMI) 38.0-38.9, adult: Secondary | ICD-10-CM

## 2024-09-24 MED ORDER — ZEPBOUND 2.5 MG/0.5ML ~~LOC~~ SOAJ: 2.5000 mg | SUBCUTANEOUS | 0 refills | Status: DC

## 2024-09-24 MED ORDER — WEGOVY 0.25 MG/0.5ML ~~LOC~~ SOAJ: 0.2500 mg | SUBCUTANEOUS | 0 refills | Status: DC

## 2024-09-24 NOTE — Progress Notes (Addendum)
 Office: [phone]  /  Fax: [phone]  WEIGHT SUMMARY AND BIOMETRICS  Weight Lost Since Last Visit: 0lb  Weight Gained Since Last Visit: 15lb   Vitals Temp: 99.2 F (37.3 C) BP: 134/81 Pulse Rate: 77 SpO2: 97 %   Anthropometric Measurements Height: 5' 7 (1.702 m) Weight: 248 lb (112.5 kg) BMI (Calculated): 38.83 Weight at Last Visit: 233lb Weight Lost Since Last Visit: 0lb Weight Gained Since Last Visit: 15lb Starting Weight: 250lb Total Weight Loss (lbs): 2 lb (0.907 kg)   Body Composition  Body Fat %: 30.5 % Fat Mass (lbs): 75.8 lbs Muscle Mass (lbs): 164.4 lbs Total Body Water (lbs): 114.2 lbs Visceral Fat Rating : 18   Other Clinical Data Fasting: Yes Labs: No Today's Visit #: 6 Starting Date: 07/27/23     HPI  Chief Complaint: OBESITY  Rodney Beltran is here to discuss his progress with his obesity treatment plan. He is on the the Category 3 Plan and states he is following his eating plan approximately 0 % of the time. He states he is exercising 0 minutes 0 days per week.   Interval History:  Since last office visit he has gained 15 pounds.  He is here today to get back on track.  He struggles with late night eating, cravings, polyphagia and portion control.  He is drinking water and seltzer water daily.  He is walking his dog to stay active.     Pharmacotherapy for weight loss: He is not currently taking medications  for medical weight loss.      Previous pharmacotherapy for medical weight loss:  None  Bariatric surgery:  Patient has not had bariatric surgery.    Vit D deficiency  He is not taking Vit D   Lab Results  Component Value Date   VD25OH 20.71 (L) 09/11/2024   VD25OH 26.5 (L) 07/27/2023     Vit B12 def Not currently taking Vit B12  Prediabetes Last A1c was 6.3  Medication(s): None Polyphagia:Yes Lab Results  Component Value Date   HGBA1C 6.3 09/11/2024   HGBA1C 6.2 (H) 06/10/2023   HGBA1C 6.1 04/07/2022   HGBA1C 6.2  04/03/2021   HGBA1C 5.8 09/14/2019   Lab Results  Component Value Date   INSULIN  17.7 07/27/2023   Hyperlipidemia Medication(s): None.    Cardiovascular risk factors: dyslipidemia, hypertension, male gender, obesity (BMI >= 30 kg/m2), and sedentary lifestyle FH:  mother with HLD  Lab Results  Component Value Date   CHOL 245 (H) 09/11/2024   HDL 46.50 09/11/2024   LDLCALC 175 (H) 09/11/2024   TRIG 118.0 09/11/2024   CHOLHDL 5 09/11/2024   Lab Results  Component Value Date   ALT 43 09/11/2024   AST 31 09/11/2024   ALKPHOS 63 09/11/2024   BILITOT 0.5 09/11/2024   The 10-year ASCVD risk score (Arnett DK, et al., 2019) is: 5.3%   Values used to calculate the score:     Age: 48 years     Clincally relevant sex: Male     Is Non-Hispanic African American: No     Diabetic: No     Tobacco smoker: No     Systolic Blood Pressure: 134 mmHg     Is BP treated: Yes     HDL Cholesterol: 46.5 mg/dL     Total Cholesterol: 245 mg/dL   Daytime sleepiness Reports daytime sleepiness and snoring.  Notes witnessed apneic pauses  Hypertension  Medication(s): zestoretic  20-12.5mg .  denies side effects.   Denies chest pain, palpitations and  SOB.  BP Readings from Last 3 Encounters:  09/24/24 134/81  09/18/24 (!) 138/98  09/11/24 138/86   Lab Results  Component Value Date   CREATININE 0.98 09/11/2024   CREATININE 1.10 06/10/2023   CREATININE 1.21 04/07/2022     PHYSICAL EXAM:  Blood pressure 134/81, pulse 77, temperature 99.2 F (37.3 C), height 5' 7 (1.702 m), weight 248 lb (112.5 kg), SpO2 97%. Body mass index is 38.84 kg/m.  General: He is overweight, cooperative, alert, well developed, and in no acute distress. PSYCH: Has normal mood, affect and thought process.   Extremities: No edema.  Neurologic: No gross sensory or motor deficits. No tremors or fasciculations noted.    DIAGNOSTIC DATA REVIEWED:  BMET    Component Value Date/Time   NA 138 09/11/2024 0813   K 3.6  09/11/2024 0813   CL 104 09/11/2024 0813   CO2 26 09/11/2024 0813   GLUCOSE 102 (H) 09/11/2024 0813   BUN 12 09/11/2024 0813   CREATININE 0.98 09/11/2024 0813   CREATININE 1.10 06/10/2023 1613   CALCIUM 9.0 09/11/2024 0813   Lab Results  Component Value Date   HGBA1C 6.3 09/11/2024   HGBA1C 5.9 03/16/2016   Lab Results  Component Value Date   INSULIN  17.7 07/27/2023   Lab Results  Component Value Date   TSH 1.22 09/11/2024   CBC    Component Value Date/Time   WBC 5.1 09/11/2024 0813   RBC 5.42 09/11/2024 0813   HGB 15.7 09/11/2024 0813   HCT 46.4 09/11/2024 0813   PLT 242.0 09/11/2024 0813   MCV 85.6 09/11/2024 0813   MCH 29.1 10/11/2013 1917   MCHC 33.7 09/11/2024 0813   RDW 13.6 09/11/2024 0813   Iron Studies No results found for: IRON, TIBC, FERRITIN, IRONPCTSAT Lipid Panel     Component Value Date/Time   CHOL 245 (H) 09/11/2024 0813   TRIG 118.0 09/11/2024 0813   HDL 46.50 09/11/2024 0813   CHOLHDL 5 09/11/2024 0813   VLDL 23.6 09/11/2024 0813   LDLCALC 175 (H) 09/11/2024 0813   LDLCALC 187 (H) 06/10/2023 1613   Hepatic Function Panel     Component Value Date/Time   PROT 7.6 09/11/2024 0813   ALBUMIN 4.6 09/11/2024 0813   AST 31 09/11/2024 0813   ALT 43 09/11/2024 0813   ALKPHOS 63 09/11/2024 0813   BILITOT 0.5 09/11/2024 0813      Component Value Date/Time   TSH 1.22 09/11/2024 0813   Nutritional Lab Results  Component Value Date   VD25OH 20.71 (L) 09/11/2024   VD25OH 26.5 (L) 07/27/2023     ASSESSMENT AND PLAN  TREATMENT PLAN FOR OBESITY:  Recommended Dietary Goals  Rodney Beltran is currently in the action stage of change. As such, his goal is to continue weight management plan. He has agreed to keeping a food journal and adhering to recommended goals of 1800 calories and 100+ grams protein.  Apps recommended:  mynetdiary, lose it app, myfitnesspal  Behavioral Intervention  We discussed the following Behavioral Modification  Strategies today: increasing lean protein intake to established goals, decreasing simple carbohydrates , increasing vegetables, increasing fiber rich foods, increasing water intake , work on meal planning and preparation, continue to work on maintaining a reduced calorie state, getting the recommended amount of protein, incorporating whole foods, making healthy choices, staying well hydrated and practicing mindfulness when eating., and increase protein intake, fibrous foods (25 grams per day for women, 30 grams for men) and water to improve satiety and decrease hunger  signals. .  Additional resources provided today: Information given on Metformin  Recommended Physical Activity Goals  Esa has been advised to work up to 150 minutes of moderate intensity aerobic activity a week and strengthening exercises 2-3 times per week for cardiovascular health, weight loss maintenance and preservation of muscle mass.   He has agreed to Think about enjoyable ways to increase daily physical activity and overcoming barriers to exercise, Increase physical activity in their day and reduce sedentary time (increase NEAT)., Work on scheduling and tracking physical activity. , Increase volume of physical activity to a goal of 240 minutes a week, and Combine aerobic and strengthening exercises for efficiency and improved cardiometabolic health.   Pharmacotherapy We discussed various medication options to help Sohail with his weight loss efforts and we both agreed to start Zepbound 2.5mg . Side effects discussed.  Contraindications:  Pancreatitis (active gallstones) Medullary thyroid  cancer High triglycerides (>500)-will need labs prior to starting Multiple Endocrine Neoplasia syndrome type 2 (MEN 2) Trying to get pregnant Breastfeeding Use with caution with taking insulin  or sulfonylureas (will need to monitor blood sugars for hypoglycemia)  ASSOCIATED CONDITIONS ADDRESSED TODAY  Action/Plan  Pre-diabetes -      Start Zepbound; Inject 2.5 mg into the skin once a week.  Dispense: 2 mL; Refill: 0. Side effects discussed.    Information given on Metformin  Vitamin D  deficiency, unspecified Start Vit D OTC per PCP recommendations  Essential hypertension, benign -     Zepbound; Inject 2.5 mg into the skin once a week.  Dispense: 2 mL; Refill: 0  Hyperlipidemia, unspecified hyperlipidemia type -     Zepbound; Inject 2.5 mg into the skin once a week.  Dispense: 2 mL; Refill: 0  Vitamin B12 deficiency Start Vit B12 per PCP recommendations  Daytime sleepiness -     Pulmonary Visit  Refer for eval for OSAS  Obesity, Class II, BMI 35-39.9 -     Pulmonary Visit -     Zepbound; Inject 2.5 mg into the skin once a week.  Dispense: 2 mL; Refill: 0      Addendum: 09/24/24  Zepbound was not covered by his insurance.  Pharmacy sent me a message stating Georjean was covered.  Will start Wegvoy 0.25mg  SQ weekly.  Side effects discussed.    09/25/24  Wegovy denied.  Was told that was a covered med.  Will start Saxenda.     Return in about 3 weeks (around 10/15/2024).SABRA He was informed of the importance of frequent follow up visits to maximize his success with intensive lifestyle modifications for his multiple health conditions.   ATTESTASTION STATEMENTS:  Reviewed by clinician on day of visit: allergies, medications, problem list, medical history, surgical history, family history, social history, and previous encounter notes.   I personally spent a total of 46 minutes in the care of the patient today including preparing to see the patient, getting/reviewing separately obtained history, performing a medically appropriate exam/evaluation, counseling and educating, placing orders, documenting clinical information in the EHR, independently interpreting results, and communicating results.    Rodney Beltran. Rokhaya Quinn FNP-C

## 2024-09-24 NOTE — Addendum Note (Signed)
 Addended by: Tylee Yum on: 09/24/2024 12:01 PM   Modules accepted: Orders

## 2024-09-24 NOTE — Patient Instructions (Signed)

## 2024-09-24 NOTE — Telephone Encounter (Signed)
 PA submitted through Cover My Meds for [facility] Awaiting insurance determination. Key: [id number]

## 2024-09-25 ENCOUNTER — Other Ambulatory Visit: Payer: Self-pay | Admitting: Nurse Practitioner

## 2024-09-25 DIAGNOSIS — E66812 Obesity, class 2: Secondary | ICD-10-CM

## 2024-09-25 MED ORDER — LIRAGLUTIDE -WEIGHT MANAGEMENT 18 MG/3ML ~~LOC~~ SOPN: PEN_INJECTOR | SUBCUTANEOUS | 0 refills | Status: AC

## 2024-09-25 NOTE — Telephone Encounter (Signed)
Why your request was denied:  *Drug Not Covered/Plan Exclusion - Your request for coverage was denied because your  prescription benefit plan does not cover the requested medication.

## 2024-09-25 NOTE — Addendum Note (Signed)
 Addended by: Kynzli Rease on: 09/25/2024 01:34 PM   Modules accepted: Orders

## 2024-10-15 ENCOUNTER — Ambulatory Visit: Admitting: Nurse Practitioner

## 2024-11-06 ENCOUNTER — Ambulatory Visit
